# Patient Record
Sex: Male | Born: 1960 | Race: White | Marital: Single | Smoking: Former smoker
Health system: Southern US, Community
[De-identification: ages and names within clinical notes are randomized; demographics above are authoritative.]

## PROBLEM LIST (undated history)

## (undated) DIAGNOSIS — F419 Anxiety disorder, unspecified: Secondary | ICD-10-CM

## (undated) DIAGNOSIS — K219 Gastro-esophageal reflux disease without esophagitis: Secondary | ICD-10-CM

## (undated) HISTORY — PX: CHOLECYSTECTOMY: SHX55

## (undated) HISTORY — DX: Anxiety disorder, unspecified: F41.9

## (undated) HISTORY — DX: Gastro-esophageal reflux disease without esophagitis: K21.9

## (undated) HISTORY — PX: CYST EXCISION: SHX5701

## (undated) HISTORY — PX: DENTAL RESTORATION/EXTRACTION WITH X-RAY: SHX5796

---

## 2016-09-09 ENCOUNTER — Encounter: Payer: Self-pay | Admitting: Family Medicine

## 2016-09-26 ENCOUNTER — Encounter: Payer: Self-pay | Admitting: Gastroenterology

## 2016-11-14 ENCOUNTER — Ambulatory Visit (AMBULATORY_SURGERY_CENTER): Payer: Self-pay

## 2016-11-14 VITALS — Ht 69.0 in | Wt 193.0 lb

## 2016-11-14 DIAGNOSIS — Z1211 Encounter for screening for malignant neoplasm of colon: Secondary | ICD-10-CM

## 2016-11-14 MED ORDER — SUPREP BOWEL PREP KIT 17.5-3.13-1.6 GM/177ML PO SOLN: 1.0000 | Freq: Once | ORAL | 0 refills | Status: AC

## 2016-11-14 NOTE — Progress Notes (Signed)
Pt special needs and gets very anxious with medical procedures.  Family requests pre-med such as [facility] in admitting dept . Thanks Bev Drennen/PV

## 2016-11-14 NOTE — Progress Notes (Signed)
No allergies to eggs or soy No diet meds No home oxygen No past problems with anesthesia  Registered emmi 

## 2016-11-18 ENCOUNTER — Telehealth: Payer: Self-pay | Admitting: Gastroenterology

## 2016-11-18 NOTE — Telephone Encounter (Signed)
Pt is scheduled for direct colonoscopy

## 2016-11-18 NOTE — Telephone Encounter (Signed)
Pt is scheduled for direct screening colonoscopy 11/28/2016.  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  Pt has a cracked tooth and needs to have it removed under anesthesia next week.  His sister is calling to ask if it will be okay to have anesthesia for tooth extraction next week and then have colonoscopy with sedation several days later.  She is waiting to schedule tooth extraction depending on your recommendation.  Thanks, Juliann Pulse in [place].

## 2016-11-18 NOTE — Telephone Encounter (Signed)
Michael Richards,  This pt is cleared for anesthetic care at [facility].  Thanks,  [name] 

## 2016-11-21 NOTE — Telephone Encounter (Signed)
Mother advised.  OK to proceed. Michael Richards/PV

## 2016-11-21 NOTE — Telephone Encounter (Signed)
Thanks for letting me know, if okay with Jenny Reichmann it's okay with me. Thanks

## 2016-11-28 ENCOUNTER — Ambulatory Visit (AMBULATORY_SURGERY_CENTER): Payer: Medicare Other | Admitting: Gastroenterology

## 2016-11-28 ENCOUNTER — Encounter: Payer: Self-pay | Admitting: Gastroenterology

## 2016-11-28 VITALS — BP 124/81 | HR 78 | Temp 97.3°F | Resp 16 | Ht 69.0 in | Wt 193.0 lb

## 2016-11-28 DIAGNOSIS — D125 Benign neoplasm of sigmoid colon: Secondary | ICD-10-CM | POA: Diagnosis not present

## 2016-11-28 DIAGNOSIS — D122 Benign neoplasm of ascending colon: Secondary | ICD-10-CM | POA: Diagnosis not present

## 2016-11-28 DIAGNOSIS — D120 Benign neoplasm of cecum: Secondary | ICD-10-CM

## 2016-11-28 DIAGNOSIS — Z1211 Encounter for screening for malignant neoplasm of colon: Secondary | ICD-10-CM

## 2016-11-28 DIAGNOSIS — R195 Other fecal abnormalities: Secondary | ICD-10-CM | POA: Diagnosis not present

## 2016-11-28 MED ORDER — SODIUM CHLORIDE 0.9 % IV SOLN: 500.0000 mL | INTRAVENOUS | Status: AC
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
## 2016-11-28 NOTE — Progress Notes (Signed)
Pt's states no medical or surgical changes since previsit or office visit. Per sister.

## 2016-11-28 NOTE — Progress Notes (Signed)
Report given to PACU, vss   Late entry 0917

## 2016-11-29 ENCOUNTER — Telehealth: Payer: Self-pay

## 2016-11-29 DIAGNOSIS — D122 Benign neoplasm of ascending colon: Secondary | ICD-10-CM | POA: Diagnosis not present

## 2016-11-29 DIAGNOSIS — D120 Benign neoplasm of cecum: Secondary | ICD-10-CM | POA: Diagnosis not present

## 2016-11-29 NOTE — Addendum Note (Signed)
Addended by: Randall Hiss B on: 11/29/2016 07:02 AM   Modules accepted: Orders
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
## 2016-11-29 NOTE — Telephone Encounter (Signed)
  Follow up Call-  Call back number 11/28/2016  Post procedure Call Back phone  # [phone]   Permission to leave phone message Yes     Patient questions:  Do you have a fever, pain , or abdominal swelling? No. Pain Score  0 *  Have you tolerated food without any problems? Yes.    Have you been able to return to your normal activities? Yes.    Do you have any questions about your discharge instructions: Diet   No. Medications  No. Follow up visit  No.  Do you have questions or concerns about your Care? No.  Actions: * If pain score is 4 or above: No action needed, pain <4.

## 2016-12-05 ENCOUNTER — Encounter: Payer: Self-pay | Admitting: Gastroenterology

## 2018-01-25 ENCOUNTER — Emergency Department (HOSPITAL_COMMUNITY): Payer: Medicare Other

## 2018-01-25 ENCOUNTER — Other Ambulatory Visit: Payer: Self-pay

## 2018-01-25 ENCOUNTER — Inpatient Hospital Stay (HOSPITAL_COMMUNITY)
Admission: EM | Admit: 2018-01-25 | Discharge: 2018-01-28 | DRG: 641 | Disposition: A | Discharge status: Home / Self Care | Payer: Medicare Other | Attending: Internal Medicine | Admitting: Internal Medicine

## 2018-01-25 ENCOUNTER — Encounter (HOSPITAL_COMMUNITY): Payer: Self-pay

## 2018-01-25 DIAGNOSIS — K219 Gastro-esophageal reflux disease without esophagitis: Secondary | ICD-10-CM | POA: Diagnosis present

## 2018-01-25 DIAGNOSIS — R197 Diarrhea, unspecified: Secondary | ICD-10-CM | POA: Diagnosis not present

## 2018-01-25 DIAGNOSIS — E872 Acidosis: Secondary | ICD-10-CM | POA: Diagnosis present

## 2018-01-25 DIAGNOSIS — R569 Unspecified convulsions: Secondary | ICD-10-CM | POA: Diagnosis not present

## 2018-01-25 DIAGNOSIS — R0689 Other abnormalities of breathing: Secondary | ICD-10-CM | POA: Diagnosis present

## 2018-01-25 DIAGNOSIS — F419 Anxiety disorder, unspecified: Secondary | ICD-10-CM | POA: Diagnosis present

## 2018-01-25 DIAGNOSIS — R631 Polydipsia: Secondary | ICD-10-CM | POA: Diagnosis present

## 2018-01-25 DIAGNOSIS — R5381 Other malaise: Secondary | ICD-10-CM

## 2018-01-25 DIAGNOSIS — F429 Obsessive-compulsive disorder, unspecified: Secondary | ICD-10-CM | POA: Diagnosis present

## 2018-01-25 DIAGNOSIS — E871 Hypo-osmolality and hyponatremia: Secondary | ICD-10-CM | POA: Diagnosis present

## 2018-01-25 DIAGNOSIS — F909 Attention-deficit hyperactivity disorder, unspecified type: Secondary | ICD-10-CM | POA: Diagnosis present

## 2018-01-25 DIAGNOSIS — D649 Anemia, unspecified: Secondary | ICD-10-CM | POA: Diagnosis present

## 2018-01-25 DIAGNOSIS — Z87891 Personal history of nicotine dependence: Secondary | ICD-10-CM | POA: Diagnosis not present

## 2018-01-25 DIAGNOSIS — J9811 Atelectasis: Secondary | ICD-10-CM | POA: Diagnosis present

## 2018-01-25 DIAGNOSIS — E875 Hyperkalemia: Secondary | ICD-10-CM | POA: Diagnosis present

## 2018-01-25 DIAGNOSIS — F840 Autistic disorder: Secondary | ICD-10-CM | POA: Diagnosis present

## 2018-01-25 DIAGNOSIS — G9340 Encephalopathy, unspecified: Secondary | ICD-10-CM | POA: Diagnosis not present

## 2018-01-25 DIAGNOSIS — F79 Unspecified intellectual disabilities: Secondary | ICD-10-CM | POA: Diagnosis present

## 2018-01-25 DIAGNOSIS — R0902 Hypoxemia: Secondary | ICD-10-CM | POA: Diagnosis present

## 2018-01-25 DIAGNOSIS — Z978 Presence of other specified devices: Secondary | ICD-10-CM | POA: Insufficient documentation

## 2018-01-25 DIAGNOSIS — E876 Hypokalemia: Secondary | ICD-10-CM | POA: Diagnosis not present

## 2018-01-25 DIAGNOSIS — G4089 Other seizures: Secondary | ICD-10-CM | POA: Diagnosis present

## 2018-01-25 LAB — DIFFERENTIAL
Abs Immature Granulocytes: 0.1 10*3/uL (ref 0.0–0.1)
Basophils Absolute: 0 10*3/uL (ref 0.0–0.1)
Basophils Relative: 0 %
Eosinophils Absolute: 0.1 10*3/uL (ref 0.0–0.7)
Eosinophils Relative: 1 %
Immature Granulocytes: 1 %
LYMPHS ABS: 1.5 10*3/uL (ref 0.7–4.0)
LYMPHS PCT: 11 %
MONOS PCT: 7 %
Monocytes Absolute: 0.9 10*3/uL (ref 0.1–1.0)
NEUTROS ABS: 10.9 10*3/uL — AB (ref 1.7–7.7)
Neutrophils Relative %: 80 %

## 2018-01-25 LAB — I-STAT CHEM 8, ED
BUN: 10 mg/dL (ref 6–20)
BUN: 8 mg/dL (ref 6–20)
BUN: 8 mg/dL (ref 6–20)
CALCIUM ION: 0.94 mmol/L — AB (ref 1.15–1.40)
CALCIUM ION: 0.99 mmol/L — AB (ref 1.15–1.40)
CALCIUM ION: 0.87 mmol/L — AB (ref 1.15–1.40)
CHLORIDE: 75 mmol/L — AB (ref 98–111)
CHLORIDE: 81 mmol/L — AB (ref 98–111)
Chloride: 75 mmol/L — ABNORMAL LOW (ref 98–111)
Creatinine, Ser: 0.6 mg/dL — ABNORMAL LOW (ref 0.61–1.24)
Creatinine, Ser: 0.5 mg/dL — ABNORMAL LOW (ref 0.61–1.24)
Creatinine, Ser: 0.4 mg/dL — ABNORMAL LOW (ref 0.61–1.24)
Glucose, Bld: 91 mg/dL (ref 70–99)
Glucose, Bld: 118 mg/dL — ABNORMAL HIGH (ref 70–99)
Glucose, Bld: 105 mg/dL — ABNORMAL HIGH (ref 70–99)
HEMATOCRIT: 36 % — AB (ref 39.0–52.0)
HEMATOCRIT: 34 % — AB (ref 39.0–52.0)
HEMATOCRIT: 32 % — AB (ref 39.0–52.0)
Hemoglobin: 12.2 g/dL — ABNORMAL LOW (ref 13.0–17.0)
Hemoglobin: 11.6 g/dL — ABNORMAL LOW (ref 13.0–17.0)
Hemoglobin: 10.9 g/dL — ABNORMAL LOW (ref 13.0–17.0)
POTASSIUM: 5.4 mmol/L — AB (ref 3.5–5.1)
Potassium: 4.8 mmol/L (ref 3.5–5.1)
Potassium: 3.8 mmol/L (ref 3.5–5.1)
SODIUM: 105 mmol/L — AB (ref 135–145)
SODIUM: 106 mmol/L — AB (ref 135–145)
Sodium: 111 mmol/L — CL (ref 135–145)
TCO2: 21 mmol/L — AB (ref 22–32)
TCO2: 15 mmol/L — AB (ref 22–32)
TCO2: 26 mmol/L (ref 22–32)

## 2018-01-25 LAB — COMPREHENSIVE METABOLIC PANEL
ALT: 23 U/L (ref 0–44)
AST: 29 U/L (ref 15–41)
Albumin: 3.9 g/dL (ref 3.5–5.0)
Alkaline Phosphatase: 83 U/L (ref 38–126)
Anion gap: 16 — ABNORMAL HIGH (ref 5–15)
BUN: 9 mg/dL (ref 6–20)
CHLORIDE: 75 mmol/L — AB (ref 98–111)
CO2: 17 mmol/L — AB (ref 22–32)
CREATININE: 0.63 mg/dL (ref 0.61–1.24)
Calcium: 8.4 mg/dL — ABNORMAL LOW (ref 8.9–10.3)
GFR calc Af Amer: >60 mL/min (ref >60)
GFR calc non Af Amer: >60 mL/min (ref >60)
Glucose, Bld: 94 mg/dL (ref 70–99)
POTASSIUM: 4.4 mmol/L (ref 3.5–5.1)
SODIUM: 108 mmol/L — AB (ref 135–145)
Total Bilirubin: 1.7 mg/dL — ABNORMAL HIGH (ref 0.3–1.2)
Total Protein: 6 g/dL — ABNORMAL LOW (ref 6.5–8.1)

## 2018-01-25 LAB — I-STAT ARTERIAL BLOOD GAS, ED
ACID-BASE EXCESS: 3 mmol/L — AB (ref 0.0–2.0)
BICARBONATE: 26.7 mmol/L (ref 20.0–28.0)
O2 Saturation: 100 %
PCO2 ART: 35.9 mmHg (ref 32.0–48.0)
PH ART: 7.476 — AB (ref 7.350–7.450)
PO2 ART: 383 mmHg — AB (ref 83.0–108.0)
Patient temperature: 97.2
TCO2: 28 mmol/L (ref 22–32)

## 2018-01-25 LAB — I-STAT TROPONIN, ED: Troponin i, poc: 0.01 ng/mL (ref 0.00–0.08)

## 2018-01-25 LAB — CBC
HEMATOCRIT: 33.8 % — AB (ref 39.0–52.0)
Hemoglobin: 12.7 g/dL — ABNORMAL LOW (ref 13.0–17.0)
MCH: 31.6 pg (ref 26.0–34.0)
MCHC: 37.6 g/dL — ABNORMAL HIGH (ref 30.0–36.0)
MCV: 84.1 fL (ref 78.0–100.0)
Platelets: 239 10*3/uL (ref 150–400)
RBC: 4.02 MIL/uL — ABNORMAL LOW (ref 4.22–5.81)
RDW: 12.9 % (ref 11.5–15.5)
WBC: 13.5 10*3/uL — ABNORMAL HIGH (ref 4.0–10.5)

## 2018-01-25 LAB — PROTIME-INR
INR: 1.05
Prothrombin Time: 13.6 seconds (ref 11.4–15.2)

## 2018-01-25 LAB — OSMOLALITY: OSMOLALITY: 225 mosm/kg — AB (ref 275–295)

## 2018-01-25 LAB — SODIUM: SODIUM: 110 mmol/L — AB (ref 135–145)

## 2018-01-25 LAB — CBG MONITORING, ED: Glucose-Capillary: 115 mg/dL — ABNORMAL HIGH (ref 70–99)

## 2018-01-25 LAB — APTT: aPTT: 28 seconds (ref 24–36)

## 2018-01-25 LAB — OSMOLALITY, URINE: Osmolality, Ur: 91 mOsm/kg — ABNORMAL LOW (ref 300–900)

## 2018-01-25 LAB — SODIUM, URINE, RANDOM: Sodium, Ur: 19 mmol/L

## 2018-01-25 MED ORDER — PROPOFOL 1000 MG/100ML IV EMUL
INTRAVENOUS | Status: AC
  Filled 2018-01-25: qty 100

## 2018-01-25 MED ORDER — MIDAZOLAM HCL 2 MG/2ML IJ SOLN: 2.0000 mg | INTRAMUSCULAR | Status: DC | PRN

## 2018-01-25 MED ORDER — LORAZEPAM 2 MG/ML IJ SOLN
1.0000 mg | Freq: Once | INTRAMUSCULAR | Status: AC
  Administered 2018-01-25: 1 mg via INTRAVENOUS

## 2018-01-25 MED ORDER — FENTANYL CITRATE (PF) 100 MCG/2ML IJ SOLN
100.0000 ug | INTRAMUSCULAR | Status: DC | PRN
  Administered 2018-01-26: 100 ug via INTRAVENOUS
  Filled 2018-01-25 (×2): qty 2

## 2018-01-25 MED ORDER — LORAZEPAM 2 MG/ML IJ SOLN
INTRAMUSCULAR | Status: AC
  Filled 2018-01-25: qty 1

## 2018-01-25 MED ORDER — SODIUM CHLORIDE 3 % IV SOLN
INTRAVENOUS | Status: DC
  Filled 2018-01-25: qty 500

## 2018-01-25 MED ORDER — FAMOTIDINE IN NACL 20-0.9 MG/50ML-% IV SOLN
20.0000 mg | Freq: Two times a day (BID) | INTRAVENOUS | Status: DC
  Administered 2018-01-26 (×2): 20 mg via INTRAVENOUS
  Filled 2018-01-25 (×2): qty 50

## 2018-01-25 MED ORDER — PROPOFOL 1000 MG/100ML IV EMUL
0.0000 ug/kg/min | INTRAVENOUS | Status: DC
  Administered 2018-01-26: 40 ug/kg/min via INTRAVENOUS
  Administered 2018-01-26: 30 ug/kg/min via INTRAVENOUS
  Filled 2018-01-25 (×2): qty 100

## 2018-01-25 MED ORDER — SODIUM CHLORIDE 0.9 % IV SOLN
1.0000 g | Freq: Once | INTRAVENOUS | Status: AC
  Administered 2018-01-25: 1 g via INTRAVENOUS
  Filled 2018-01-25: qty 10

## 2018-01-25 MED ORDER — ETOMIDATE 2 MG/ML IV SOLN
INTRAVENOUS | Status: AC | PRN
  Administered 2018-01-25: 20 mg via INTRAVENOUS

## 2018-01-25 MED ORDER — SODIUM CHLORIDE 3 % IV SOLN
INTRAVENOUS | Status: DC
  Administered 2018-01-25: 50 mL/h via INTRAVENOUS

## 2018-01-25 MED ORDER — FENTANYL CITRATE (PF) 100 MCG/2ML IJ SOLN
100.0000 ug | INTRAMUSCULAR | Status: DC | PRN
  Administered 2018-01-25: 100 ug via INTRAVENOUS

## 2018-01-25 MED ORDER — SODIUM CHLORIDE 3 % IV SOLN: INTRAVENOUS | Status: DC

## 2018-01-25 MED ORDER — SODIUM CHLORIDE 3 % IV SOLN
INTRAVENOUS | Status: DC
  Administered 2018-01-25: 75 mL/h via INTRAVENOUS
  Filled 2018-01-25 (×3): qty 500

## 2018-01-25 MED ORDER — LEVETIRACETAM IN NACL 1500 MG/100ML IV SOLN
1500.0000 mg | INTRAVENOUS | Status: AC
  Administered 2018-01-25: 1500 mg via INTRAVENOUS
  Filled 2018-01-25: qty 100

## 2018-01-25 MED ORDER — SUCCINYLCHOLINE CHLORIDE 20 MG/ML IJ SOLN
INTRAMUSCULAR | Status: AC | PRN
  Administered 2018-01-25: 100 mg via INTRAVENOUS

## 2018-01-25 MED ORDER — HEPARIN SODIUM (PORCINE) 5000 UNIT/ML IJ SOLN
5000.0000 [IU] | Freq: Three times a day (TID) | INTRAMUSCULAR | Status: DC
  Administered 2018-01-26 – 2018-01-28 (×8): 5000 [IU] via SUBCUTANEOUS
  Filled 2018-01-25 (×7): qty 1

## 2018-01-25 NOTE — ED Triage Notes (Signed)
Pt arrived via [facility] called out as Code Stroke for AMS from home with LSN at 1500. Family reports new swollen face. Pt holding arms up in air upon arrival, with shaking movements when touched by any staff. Attempted to get pt in CT scanner pt started having a seizure, placed on 4L O2 and back on stretcher with support from staff.

## 2018-01-25 NOTE — Progress Notes (Signed)
PCCM Interval Progress Note  Discussed repeat Na via iSTAT with ED RN.  iSTAT resulted with Na of 110.  This was after roughly only 30 min of 3% at 2ml/hr, roughly 20 min at 44ml/hr, and roughly 10 - 15 min at 63ml/hr (after I asked for it to be increased during my eval based on calculations that a rate of 53ml/hr would be adequate for correction of 0.34mmol/L/hr).  I question accuracy of iSTAT; therefore, asked RN to please repeat.  If repeat is indeed 110 or higher, I've asked RN to lower 3% from 13ml/hr back to 26ml/hr.  If repeat is still low, then maintain at 67ml/hr for now and follow repeat labs in 2 hours.  Also discussed with pharmacy who will follow labs closely and call [facility] if rate of adjustment is too rapid and rate of 3% infusion needs to changed.   Michael Richards, [place] - C 01/25/2018, 9:53 PM

## 2018-01-25 NOTE — ED Notes (Signed)
Attempted report x1. 

## 2018-01-25 NOTE — ED Notes (Signed)
Hypertonic solution IV increased to 97ml/hr per verbal order R. Shearon Stalls, PA-C.

## 2018-01-25 NOTE — H&P (Signed)
NAME:  Michael Richards, MRN:  [id number], DOB:  07/11/1960, LOS: 0 ADMISSION DATE:  01/25/2018, CONSULTATION DATE:  01/25/18 REFERRING MD:  Gilford Raid CHIEF COMPLAINT:  AMS   Brief History   Michael Richards is a 57 y.o. 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  Required intubation for airway protection.  Significant Hospital Events   10/3 > admit.  Consults: date of consult/date signed off & final recs:  Neurology 10/3  Procedures (surgical and bedside):  ETT 10/3 >  Significant Diagnostic Tests:  CT head 10/3 > negative mod motion degraded exam.  Micro Data: None.  Antimicrobials:  None.   Subjective:  On vent, somewhat agitated.  Has been shaking feet since arrival in ED.  EEG negative for seizures.  Objective   Blood pressure (!) 232/102, pulse 73, temperature (!) 97.1 F (36.2 C), temperature source Rectal, resp. rate 20, height 5\' 10"  (1.778 m), weight 86.2 kg, SpO2 93 %.    Vent Mode: PRVC FiO2 (%):  [100 %] 100 % Set Rate:  [16 bmp] 16 bmp Vt Set:  [580 mL] 580 mL PEEP:  [5 cmH20] 5 cmH20   Intake/Output Summary (Last 24 hours) at 01/25/2018 2050 Last data filed at 01/25/2018 1916 Gross per 24 hour  Intake 84.85 ml  Output -  Net 84.85 ml   Filed Weights   01/25/18 1852  Weight: 86.2 kg    Examination: General: Young male, resting in bed, in NAD. Neuro: Sedated, somewhat agitated.  Bilateral feet shaking. HEENT: [place]/AT. Sclerae anicteric.  ETT in place. Cardiovascular: RRR, no M/R/G.  Lungs: Respirations even and unlabored.  CTA bilaterally, No W/R/R. Abdomen: BS x 4, soft, NT/ND.  Musculoskeletal: No gross deformities, no edema.  Skin: Intact, warm, no rashes.  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 Pt also on an SSRI (escitalopram) that per mother was just started 1.5 - 2 weeks  ago.  Given recent initiation of SSRI, timing of severe hyponatremia is likely acute to subacute; therefore, would not aggressively correct Na but rather aim for standard 0.53mmol/L/hr.  Per calculations, to achieve this rate of correction using 3% saline, a rate of 65 ml/hr would be adequate. - Continue 3% NS, place at rate of 70ml/hr for now. - Check Na q2hrs x 2 then q4hrs, goal is to have Na rise of 0.69mmol/L/hr.  Hypocalcemia. - 1g Ca gluconate.  Seizures - presumed due to above. - Correct Na as above. - Neurology following. - AED's per neuro - received 1500mg  loading dose Keppra but no further AED's planned for now.  Respiratory insufficiency - due to inability to protect the airway in the setting of seizures. - Full vent support. - Assess ABG. - Wean as able. - Daily SBT. - Bronchial hygiene. - Follow CXR.  Sedation needs due to mechanical ventilation. - Propofol gtt / fentanyl PRN / midazolam PRN. - RASS goal: 0 to -1. - Daily WUA.  Loose stools - ? Due to excessive water intake.  No recent abx use to suspect C.diff. - Will send GI panel for completeness.  Hx autism, mental disability, OCD, ADHD. - Hold preadmission cogentin, clonazepam, escitalopram, risperidone.   Disposition / Summary of Today's Plan 01/25/18   Admit to ICU. Continue 3% NS, ensure Na does not rise faster than 0.15mmol/L/hr.    Diet: NPO. Pain/Anxiety/Delirium protocol (if  indicated): in place. VAP protocol (if indicated): in place. DVT prophylaxis: SCD's / Heparin. GI prophylaxis: Famotidine. Hyperglycemia protocol: None. Mobility: Bedrest. Code Status: Full. Family Communication: Mother, Sister, Brother in law updated at bedside.  Labs   CBC: Recent Labs  Lab 01/25/18 1824 01/25/18 1828 01/25/18 1907  WBC 13.5*  --   --   NEUTROABS 10.9*  --   --   HGB 12.7* 12.2* 11.6*  HCT 33.8* 36.0* 34.0*  MCV 84.1  --   --   PLT 239  --   --    Basic Metabolic Panel: Recent Labs  Lab  01/25/18 1824 01/25/18 1828 01/25/18 1907  NA 108* 105* 106*  K 4.4 4.8 3.8  CL 75* 75* 75*  CO2 17*  --   --   GLUCOSE 94 91 118*  BUN 9 10 8   CREATININE 0.63 0.60* 0.50*  CALCIUM 8.4*  --   --    GFR: Estimated Creatinine Clearance: 105.2 mL/min (A) (by C-G formula based on SCr of 0.5 mg/dL (L)). Recent Labs  Lab 01/25/18 1824  WBC 13.5*   Liver Function Tests: Recent Labs  Lab 01/25/18 1824  AST 29  ALT 23  ALKPHOS 83  BILITOT 1.7*  PROT 6.0*  ALBUMIN 3.9   No results for input(s): LIPASE, AMYLASE in the last 168 hours. No results for input(s): AMMONIA in the last 168 hours. ABG    Component Value Date/Time   TCO2 15 (L) 01/25/2018 1907    Coagulation Profile: Recent Labs  Lab 01/25/18 1824  INR 1.05   Cardiac Enzymes: No results for input(s): CKTOTAL, CKMB, CKMBINDEX, TROPONINI in the last 168 hours. HbA1C: No results found for: HGBA1C CBG: Recent Labs  Lab 01/25/18 1907  GLUCAP 115*    Admitting History of Present Illness.   Pt is encephelopathic; therefore, this HPI is obtained from chart review. Michael Richards is a 57 y.o. male who has a PMH as outlined below including but not limited to autism, mental disability, OCD, ADHD, and psychogenic polydipsia.  He presented to [facility] ED evening of 10/3 with AMS.  Was last seen normal around 1500 that afternoon.  He was initially brought in as code stroke and while getting CT of head, he had a tonic clonic seizure.  Code stroke was cancelled and pt was taken back to ED where he was intubated for airway protection. Labs demonstrated severe hyponatremia with Na of 106 which was felt to be due to psychogenic polydipsia vs hypovolemia from gastroenteritis / loose stools.  Under "Care Everywhere", his last Na on 8/8 was 137 from a family medicine outpatient visit.  He was seen in consultation by neurology and was loaded with 1500mg  keppra.  He was also started on 3% NS.  Review of Systems:   Not able to obtain as  pt is encephalopathic.  Past medical history  He,  has a past medical history of Anxiety and GERD (gastroesophageal reflux disease).   Surgical History    Past Surgical History:  Procedure Laterality Date  . CHOLECYSTECTOMY    . CYST EXCISION     back neck  . DENTAL RESTORATION/EXTRACTION WITH X-RAY       Social History   Social History   Socioeconomic History  . Marital status: Single    Spouse name: Not on file  . Number of children: Not on file  . Years of education: Not on file  . Highest education level: Not on file  Occupational History  . Not  on file  Social Needs  . Financial resource strain: Not on file  . Food insecurity:    Worry: Not on file    Inability: Not on file  . Transportation needs:    Medical: Not on file    Non-medical: Not on file  Tobacco Use  . Smoking status: Former Smoker    Last attempt to quit: 11/15/1995    Years since quitting: 22.2  . Smokeless tobacco: Never Used  Substance and Sexual Activity  . Alcohol use: Yes    Comment: "sometimes beer and wine"  . Drug use: No  . Sexual activity: Not on file  Lifestyle  . Physical activity:    Days per week: Not on file    Minutes per session: Not on file  . Stress: Not on file  Relationships  . Social connections:    Talks on phone: Not on file    Gets together: Not on file    Attends religious service: Not on file    Active member of club or organization: Not on file    Attends meetings of clubs or organizations: Not on file    Relationship status: Not on file  . Intimate partner violence:    Fear of current or ex partner: Not on file    Emotionally abused: Not on file    Physically abused: Not on file    Forced sexual activity: Not on file  Other Topics Concern  . Not on file  Social History Narrative  . Not on file  ,  reports that he quit smoking about 22 years ago. He has never used smokeless tobacco. He reports that he drinks alcohol. He reports that he does not use  drugs.   Family history   His family history includes Colon cancer (age of onset: 57) in his paternal uncle.   Allergies No Known Allergies  Home meds  Prior to Admission medications   Medication Sig Start Date End Date Taking? Authorizing Provider  benztropine (COGENTIN) 2 MG tablet Take 2 mg by mouth 2 (two) times daily. 01/20/18  Yes [provider]  clonazePAM (KLONOPIN) 0.5 MG tablet Take 0.125-0.75 mg by mouth See admin instructions. 0.125mg  in the morning and afternoon, then 0.75mg  at bedtime.   Yes [provider]  escitalopram (LEXAPRO) 10 MG tablet Take 10 mg by mouth daily. 01/24/18  Yes [provider]  Multiple Vitamins-Minerals (CENTRUM SILVER PO) Take 1 tablet by mouth daily.    Yes [provider]  ranitidine (ZANTAC) 300 MG capsule Take 300 mg by mouth at bedtime.  11/04/17  Yes [provider]  risperiDONE (RISPERDAL) 1 MG tablet Take 1 mg by mouth daily.    Yes [provider]   CC time: 35 min.   Montey Hora, [facility] Pager: [phone]  or [phone] 01/25/2018, 8:50 PM

## 2018-01-25 NOTE — ED Notes (Signed)
Hypertonic solution IV decreased to 67ml/hr per verbal order Dr. Gilford Raid.

## 2018-01-25 NOTE — Code Documentation (Signed)
Successful intubation by Dr. Tobi Bastos

## 2018-01-25 NOTE — Consult Note (Signed)
Neurology Consultation Reason for Consult: Seizure Referring Physician: Aileen Pilot  CC: seizure  History is obtained from:Family  HPI: Michael Richards is a 57 y.o. male with a history of mental retardation and psychogenic polydipsia who was normal earlier today when someone dropped him off at home at 3 PM.  On his mother's arrival home a couple of hours later, she found him slumped over the counter and severely altered and diaphoretic.  She dialed 911 who activated a code stroke.  On arrival, he was thrashing, with increased tone, but did not clearly have any ongoing seizure activity.  After take him to the CT scanner, he did have clear clonic activity with flexion of his upper extremities that was clearly ictal.  This lasted for approximately 30 seconds followed by postictal breathing.  Do this, CT scan was aborted out of concern for his airway and he was taken back to the room where he has gradually shown some signs of improvement, but continues to alternately lift his legs and move his arms in an agitated fashion, but not rhythmic and not reasonably seizure activity.   Family does note that he has been complaining of some loose stools recently as well.  LKW: 3 PM tpa given?: no, not a stroke    ROS:  Unable to obtain due to altered mental status.   Past Medical History:  Diagnosis Date  . Anxiety   . GERD (gastroesophageal reflux disease)      Family History  Problem Relation Age of Onset  . Colon cancer Paternal Uncle 18     Social History:  reports that he quit smoking about 22 years ago. He has never used smokeless tobacco. He reports that he drinks alcohol. He reports that he does not use drugs.   Exam: Current vital signs: BP (!) 232/102   Pulse 73   Temp (!) 97.1 F (36.2 C) (Rectal)   Resp 20   Ht 5\' 10"  (1.778 m)   Wt 86.2 kg   SpO2 98%   BMI 27.26 kg/m  Vital signs in last 24 hours: Temp:  [97.1 F (36.2 C)] 97.1 F (36.2 C) (10/03 1927) Pulse Rate:   [73] 73 (10/03 1915) Resp:  [20] 20 (10/03 1915) BP: (232)/(102) 232/102 (10/03 1915) SpO2:  [98 %] 98 % (10/03 1915) Weight:  [86.2 kg] 86.2 kg (10/03 1852)   Physical Exam  Constitutional: Appears well-developed and well-nourished.  Psych: Does not respond Eyes: No scleral injection HENT: No OP obstrucion Head: Normocephalic.  Cardiovascular: Normal rate and regular rhythm.  Respiratory: Effort normal, non-labored breathing GI: Soft.  No distension. There is no tenderness.  Skin: WDI  Neuro: Mental Status: Patient is obtunded, though he does respond to noxious stimulation with localization initially Cranial Nerves: II: Does not blink to threat from either direction pupils are equal, round, and reactive to light.   III,IV, VI: Eyes are midline, he resists head turning, no eye deviation V: He does respond to noxious stimulation bilateral face VII: Facial movement is symmetric.  Motor: He has good strength in all 4 extremities, though he does not cooperate with formal strength testing  sensory: He responds to noxious stimulation in all 4 extremities  Deep Tendon Reflexes: 2+ and symmetric in the biceps and patellae.  No ankle clonus. Cerebellar: Does not perform  I have reviewed labs in epic and the results pertinent to this consultation are: CMP-sodium 108, CO2 17  CT head- pending   Impression: 57 year old male with new onset seizure  in the setting of severe hyponatremia.  I suspect that this represents psychogenic polydipsia or hyponatremia due to gastroenteritis with subsequent altered mental status and seizure.  It is not clear to me that the movements that he was doing on arrival or seizure activity, but he did have a clear seizure in the CT scanner.  Though he will not necessarily need long-term antiepileptic therapy, pending immediate evaluation I would favor giving him at least a single dose of Keppra.  I am not certain of the acuity of the hyponatremia, the most  recent sodium from 9/12 was 137.  Recommendations: 1) stat EEG 2) CT head 3) load with Keppra 1500 mg x 1 4) hyponatremia per ER/  Roland Rack, MD [facility] [phone]  If 7pm- 7am, please page neurology on call as listed in [place].

## 2018-01-25 NOTE — Procedures (Signed)
History: 57 year old male presented with hyponatremia with seizure and persistent altered mental status  Sedation: Propofol  Technique: This is a 21 channel routine scalp EEG performed at the bedside with bipolar and monopolar montages arranged in accordance to the international 10/20 system of electrode placement. One channel was dedicated to EKG recording.    Background: The background consists predominantly of low voltage generalized irregular delta activity.  There is significant elect to go artifact throughout the study, but during brief periods of attenuation, there is no underlying concerning activity.  There is a posterior dominant rhythm of 9 to 10 Hz which is poorly formed and poorly sustained.  Photic stimulation: Physiologic driving is not performed  EEG Abnormalities: 1) generalized irregular slow activity  Clinical Interpretation: This  EEG is consistent with a generalized nonspecific cerebral dysfunction (encephalopathy).  There was no seizure or seizure predisposition recorded on this study. Please note that a normal EEG does not preclude the possibility of epilepsy.   Roland Rack, MD [facility] [phone]  If 7pm- 7am, please page neurology on call as listed in [place].

## 2018-01-25 NOTE — ED Notes (Signed)
[facility] called to cancel code stroke per Dr Kirkpatrick 

## 2018-01-25 NOTE — ED Provider Notes (Signed)
Procedure Name: Intubation Date/Time: 01/25/2018 11:07 PM Performed by: Keenan Bachelor, MD Pre-anesthesia Checklist: Patient identified, Emergency Drugs available, Suction available, Patient being monitored and Timeout performed Oxygen Delivery Method: Ambu bag Preoxygenation: Pre-oxygenation with 100% oxygen Induction Type: Rapid sequence Ventilation: Mask ventilation without difficulty Laryngoscope Size: Mac and 3 Grade View: Grade I Tube size: 7.5 mm Number of attempts: 1 Placement Confirmation: ETT inserted through vocal cords under direct vision,  Positive ETCO2 and Breath sounds checked- equal and bilateral Secured at: 23 cm Tube secured with: ETT holder Difficulty Due To: Difficult Airway- due to large tongue and Difficulty was anticipated         Keenan Bachelor, MD 01/25/18 1027    Isla Pence, MD 01/25/18 2321

## 2018-01-25 NOTE — ED Notes (Signed)
Code stroke canceled  per Leonel Ramsay MD

## 2018-01-25 NOTE — Progress Notes (Signed)
Stat EEG completed, results pending.  Dr Leonel Ramsay reviewed.

## 2018-01-25 NOTE — ED Provider Notes (Signed)
[facility] EMERGENCY DEPARTMENT Provider Note   CSN: [id number] Arrival date & time: 01/25/18  6789     History   Chief Complaint Chief Complaint  Patient presents with  . Code Stroke    HPI Michael Richards is a 57 y.o. male.  Pt presents to the ED with altered level of consciousness.  EMS called a code stroke.  Per EMS, the pt was normal at 1500.  Family came home and found him very confused and not talking.  Pt is unable to give any hx.     Past Medical History:  Diagnosis Date  . Anxiety   . GERD (gastroesophageal reflux disease)     There are no active problems to display for this patient.   Past Surgical History:  Procedure Laterality Date  . CHOLECYSTECTOMY    . CYST EXCISION     back neck  . DENTAL RESTORATION/EXTRACTION WITH X-RAY          Home Medications    Prior to Admission medications   Medication Sig Start Date End Date Taking? Authorizing Provider  benztropine (COGENTIN) 2 MG tablet Take 2 mg by mouth 2 (two) times daily. 01/20/18  Yes [provider]  clonazePAM (KLONOPIN) 0.5 MG tablet Take 0.125-0.75 mg by mouth See admin instructions. 0.125mg  in the morning and afternoon, then 0.75mg  at bedtime.   Yes [provider]  escitalopram (LEXAPRO) 10 MG tablet Take 10 mg by mouth daily. 01/24/18  Yes [provider]  Multiple Vitamins-Minerals (CENTRUM SILVER PO) Take 1 tablet by mouth daily.    Yes [provider]  ranitidine (ZANTAC) 300 MG capsule Take 300 mg by mouth at bedtime.  11/04/17  Yes [provider]  risperiDONE (RISPERDAL) 1 MG tablet Take 1 mg by mouth daily.    Yes [provider]    Family History Family History  Problem Relation Age of Onset  . Colon cancer Paternal Uncle 67    Social History Social History   Tobacco Use  . Smoking status: Former Smoker    Last attempt to quit: 11/15/1995    Years since quitting: 22.2  . Smokeless tobacco: Never Used    Substance Use Topics  . Alcohol use: Yes    Comment: "sometimes beer and wine"  . Drug use: No     Allergies   Patient has no known allergies.   Review of Systems Review of Systems  Unable to perform ROS: Mental status change  All other systems reviewed and are negative.    Physical Exam Updated Vital Signs BP (!) 232/102   Pulse 73   Temp (!) 97.1 F (36.2 C) (Rectal)   Resp 20   Ht 5\' 10"  (1.778 m)   Wt 86.2 kg   SpO2 93%   BMI 27.26 kg/m   Physical Exam  Constitutional: He appears well-developed and well-nourished.  HENT:  Head: Normocephalic and atraumatic.  Right Ear: External ear normal.  Left Ear: External ear normal.  Nose: Nose normal.  Mouth/Throat: Oropharynx is clear and moist.  Eyes: Pupils are equal, round, and reactive to light. Conjunctivae and EOM are normal.  Neck: Normal range of motion. Neck supple.  Cardiovascular: Normal rate, regular rhythm, normal heart sounds and intact distal pulses.  Pulmonary/Chest: Effort normal and breath sounds normal. Tachypnea noted.  Abdominal: Soft. Bowel sounds are normal.  Musculoskeletal: Normal range of motion.  Neurological:  When pt first arrived, he was shaking all 4 extremities, and very agitated.  He  would stop and seem to respond.    Skin: Skin is warm. Capillary refill takes less than 2 seconds.  Nursing note and vitals reviewed.    ED Treatments / Results  Labs (all labs ordered are listed, but only abnormal results are displayed) Labs Reviewed  CBC - Abnormal; Notable for the following components:      Result Value   WBC 13.5 (*)    RBC 4.02 (*)    Hemoglobin 12.7 (*)    HCT 33.8 (*)    MCHC 37.6 (*)    All other components within normal limits  DIFFERENTIAL - Abnormal; Notable for the following components:   Neutro Abs 10.9 (*)    All other components within normal limits  COMPREHENSIVE METABOLIC PANEL - Abnormal; Notable for the following components:   Sodium 108 (*)    Chloride  75 (*)    CO2 17 (*)    Calcium 8.4 (*)    Total Protein 6.0 (*)    Total Bilirubin 1.7 (*)    Anion gap 16 (*)    All other components within normal limits  CBG MONITORING, ED - Abnormal; Notable for the following components:   Glucose-Capillary 115 (*)    All other components within normal limits  I-STAT CHEM 8, ED - Abnormal; Notable for the following components:   Sodium 105 (*)    Chloride 75 (*)    Creatinine, Ser 0.60 (*)    Calcium, Ion 0.94 (*)    TCO2 21 (*)    Hemoglobin 12.2 (*)    HCT 36.0 (*)    All other components within normal limits  I-STAT CHEM 8, ED - Abnormal; Notable for the following components:   Sodium 106 (*)    Chloride 75 (*)    Creatinine, Ser 0.50 (*)    Glucose, Bld 118 (*)    Calcium, Ion 0.99 (*)    TCO2 15 (*)    Hemoglobin 11.6 (*)    HCT 34.0 (*)    All other components within normal limits  PROTIME-INR  APTT  SODIUM  SODIUM  SODIUM  OSMOLALITY  OSMOLALITY, URINE  SODIUM, URINE, RANDOM  I-STAT TROPONIN, ED    EKG EKG Interpretation  Date/Time:  Thursday January 25 2018 18:37:58 EDT Ventricular Rate:  109 PR Interval:    QRS Duration: 94 QT Interval:  313 QTC Calculation: 422 R Axis:   54 Text Interpretation:  Sinus tachycardia Confirmed by Isla Pence [phone]) on 01/25/2018 7:19:10 PM   Radiology Ct Head Wo Contrast  Result Date: 01/25/2018 CLINICAL DATA:  Face swelling, shaking. Last seen normal at 1500 hours. EXAM: CT HEAD WITHOUT CONTRAST TECHNIQUE: Contiguous axial images were obtained from the base of the skull through the vertex without intravenous contrast. COMPARISON:  None. FINDINGS: Moderate motion degraded examination. BRAIN: No intraparenchymal hemorrhage, mass effect nor midline shift. The ventricles and sulci are normal. No acute large vascular territory infarcts. No abnormal extra-axial fluid collections. Basal cisterns are patent. VASCULAR: Unremarkable. SKULL/SOFT TISSUES: No skull fracture. No significant  soft tissue swelling. ORBITS/SINUSES: The included ocular globes and orbital contents are normal.Trace paranasal sinus mucosal thickening. Mastoid air cells are well aerated. OTHER: Life support lines in place. IMPRESSION: Negative moderately motion degraded CT HEAD without contrast. Acute findings discussed with and reconfirmed by Dr.Larina Lieurance on 01/25/2018 at 8:34 pm. Electronically Signed   By: Elon Alas M.D.   On: 01/25/2018 20:36   Dg Chest Portable 1 View  Result Date: 01/25/2018 CLINICAL DATA:  Intubated. EXAM: PORTABLE CHEST 1 VIEW COMPARISON:  Earlier today. FINDINGS: Endotracheal tube tip just above the level of the clavicles. Nasogastric tube extending into the stomach. Mildly enlarged cardiac silhouette with improvement. Clear lungs. Decrease in prominence of the pulmonary vasculature and interstitial markings. Thoracic spine degenerative changes. IMPRESSION: 1. Endotracheal tube slightly high in position. It is recommended that this be advanced 1 cm. 2. Improved changes of congestive heart failure. Electronically Signed   By: Claudie Revering M.D.   On: 01/25/2018 20:19   Dg Chest Portable 1 View  Result Date: 01/25/2018 CLINICAL DATA:  Acute shortness of breath. EXAM: PORTABLE CHEST 1 VIEW COMPARISON:  None FINDINGS: The cardiomediastinal silhouette is unremarkable. Mild peribronchial thickening noted. There is no evidence of focal airspace disease, pulmonary edema, suspicious pulmonary nodule/mass, pleural effusion, or pneumothorax. No acute bony abnormalities are identified. IMPRESSION: Mild peribronchial thickening without focal pneumonia. This is of uncertain chronicity. Electronically Signed   By: Margarette Canada M.D.   On: 01/25/2018 19:45    Procedures Procedure Name: Intubation Date/Time: 01/25/2018 8:45 PM Performed by: Isla Pence, MD Pre-anesthesia Checklist: Patient identified, Patient being monitored, Emergency Drugs available, Timeout performed and Suction  available Oxygen Delivery Method: Ambu bag Preoxygenation: Pre-oxygenation with 100% oxygen Induction Type: Rapid sequence Ventilation: Mask ventilation without difficulty Laryngoscope Size: Glidescope and 3 Tube size: 7.5 mm Number of attempts: 1 Placement Confirmation: ETT inserted through vocal cords under direct vision,  CO2 detector and Breath sounds checked- equal and bilateral Secured at: 23 cm Tube secured with: ETT holder Dental Injury: Teeth and Oropharynx as per pre-operative assessment       (including critical care time)  Medications Ordered in ED Medications  LORazepam (ATIVAN) 2 MG/ML injection (has no administration in time range)  sodium chloride (hypertonic) 3 % solution (75 mL/hr Intravenous New Bag/Given 01/25/18 1951)  propofol (DIPRIVAN) 1000 MG/100ML infusion (has no administration in time range)  levETIRAcetam (KEPPRA) IVPB 1500 mg/ 100 mL premix (0 mg Intravenous Stopped 01/25/18 1916)  LORazepam (ATIVAN) injection 1 mg (1 mg Intravenous Given 01/25/18 1932)  etomidate (AMIDATE) injection (20 mg Intravenous Given 01/25/18 1957)  succinylcholine (ANECTINE) injection (100 mg Intravenous Given 01/25/18 1958)     Initial Impression / Assessment and Plan / ED Course  I have reviewed the triage vital signs and the nursing notes.  Pertinent labs & imaging results that were available during my care of the patient were reviewed by me and considered in my medical decision making (see chart for details).  As code stroke was called, pt went to the CT scanner and had a seizure.  Dr. Leonel Ramsay (neuro) did respond to code stroke and saw this seizure.  His CT scan was aborted and he went back to his room.  He continues to make rhythmic movements with his arms and legs.  He is not responding to commands.  He was initially given keppra and ativan for the seizure  Pt acutely hyponatremic with severe mental status changes.  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  Sodium will be  corrected with 3% sodium per protocol.  Pt's GCS is worsening, so he was intubated.  Pt also has a bedside EEG that is just getting started.  Pt d/w CCM who will admit.  They asked we decrease rate of sodium down to 20 cc/hr.  This was done.  CRITICAL CARE Performed by: Isla Pence   Total critical care time: 45 minutes  Critical care time was exclusive of separately billable procedures and treating other patients.  Critical care was necessary to treat or prevent imminent or life-threatening deterioration.  Critical care was time spent personally by me on the following activities: development of treatment plan with patient and/or surrogate as well as nursing, discussions with consultants, evaluation of patient's response to treatment, examination of patient, obtaining history from patient or surrogate, ordering and performing treatments and interventions, ordering and review of laboratory studies, ordering and review of radiographic studies, pulse oximetry and re-evaluation of patient's condition.  Final Clinical Impressions(s) / ED Diagnoses   Final diagnoses:  Hyponatremia  Seizure [facility])    ED Discharge Orders    None       Isla Pence, MD 01/25/18 2048

## 2018-01-25 NOTE — ED Notes (Signed)
Given 2mg  ativan IV per Dr. Leonel Ramsay.

## 2018-01-25 NOTE — ED Notes (Signed)
1 mg Ativan given, pulled up by pharmacy, verbal from [facility] MD

## 2018-01-26 ENCOUNTER — Inpatient Hospital Stay (HOSPITAL_COMMUNITY): Payer: Medicare Other

## 2018-01-26 LAB — COMPREHENSIVE METABOLIC PANEL
ALT: 22 U/L (ref 0–44)
AST: 36 U/L (ref 15–41)
Albumin: 3.3 g/dL — ABNORMAL LOW (ref 3.5–5.0)
Alkaline Phosphatase: 75 U/L (ref 38–126)
Anion gap: 11 (ref 5–15)
BUN: 5 mg/dL — ABNORMAL LOW (ref 6–20)
CHLORIDE: 91 mmol/L — AB (ref 98–111)
CO2: 21 mmol/L — ABNORMAL LOW (ref 22–32)
CREATININE: 0.6 mg/dL — AB (ref 0.61–1.24)
Calcium: 8.4 mg/dL — ABNORMAL LOW (ref 8.9–10.3)
GFR calc Af Amer: >60 mL/min (ref >60)
GFR calc non Af Amer: >60 mL/min (ref >60)
GLUCOSE: 93 mg/dL (ref 70–99)
Potassium: 3.2 mmol/L — ABNORMAL LOW (ref 3.5–5.1)
Sodium: 123 mmol/L — ABNORMAL LOW (ref 135–145)
Total Bilirubin: 1.2 mg/dL (ref 0.3–1.2)
Total Protein: 5.1 g/dL — ABNORMAL LOW (ref 6.5–8.1)

## 2018-01-26 LAB — BASIC METABOLIC PANEL
Anion gap: 9 (ref 5–15)
Anion gap: 7 (ref 5–15)
BUN: 6 mg/dL (ref 6–20)
BUN: <5 mg/dL — ABNORMAL LOW (ref 6–20)
CALCIUM: 6.9 mg/dL — AB (ref 8.9–10.3)
CALCIUM: 8.2 mg/dL — AB (ref 8.9–10.3)
CO2: 21 mmol/L — ABNORMAL LOW (ref 22–32)
CO2: 24 mmol/L (ref 22–32)
CREATININE: 0.68 mg/dL (ref 0.61–1.24)
CREATININE: 0.75 mg/dL (ref 0.61–1.24)
Chloride: 77 mmol/L — ABNORMAL LOW (ref 98–111)
Chloride: 105 mmol/L (ref 98–111)
GFR calc Af Amer: >60 mL/min (ref >60)
GFR calc non Af Amer: >60 mL/min (ref >60)
GFR calc non Af Amer: >60 mL/min (ref >60)
Glucose, Bld: 619 mg/dL (ref 70–99)
Glucose, Bld: 84 mg/dL (ref 70–99)
Potassium: 2.5 mmol/L — CL (ref 3.5–5.1)
Potassium: 3.7 mmol/L (ref 3.5–5.1)
SODIUM: 107 mmol/L — AB (ref 135–145)
Sodium: 136 mmol/L (ref 135–145)

## 2018-01-26 LAB — POCT I-STAT 3, ART BLOOD GAS (G3+)
ACID-BASE DEFICIT: 1 mmol/L (ref 0.0–2.0)
BICARBONATE: 22.3 mmol/L (ref 20.0–28.0)
O2 SAT: 100 %
TCO2: 23 mmol/L (ref 22–32)
pCO2 arterial: 33.6 mmHg (ref 32.0–48.0)
pH, Arterial: 7.435 (ref 7.350–7.450)
pO2, Arterial: 172 mmHg — ABNORMAL HIGH (ref 83.0–108.0)

## 2018-01-26 LAB — URINALYSIS, ROUTINE W REFLEX MICROSCOPIC
Bilirubin Urine: NEGATIVE
Glucose, UA: NEGATIVE mg/dL
HGB URINE DIPSTICK: NEGATIVE
Ketones, ur: 5 mg/dL — AB
Leukocytes, UA: NEGATIVE
Nitrite: NEGATIVE
Protein, ur: NEGATIVE mg/dL
SPECIFIC GRAVITY, URINE: 1.001 — AB (ref 1.005–1.030)
pH: 6 (ref 5.0–8.0)

## 2018-01-26 LAB — CBC
HCT: 28 % — ABNORMAL LOW (ref 39.0–52.0)
Hemoglobin: 10.3 g/dL — ABNORMAL LOW (ref 13.0–17.0)
MCH: 31.3 pg (ref 26.0–34.0)
MCHC: 36.8 g/dL — AB (ref 30.0–36.0)
MCV: 85.1 fL (ref 78.0–100.0)
Platelets: 139 10*3/uL — ABNORMAL LOW (ref 150–400)
RBC: 3.29 MIL/uL — ABNORMAL LOW (ref 4.22–5.81)
RDW: 12.6 % (ref 11.5–15.5)
WBC: 5.9 10*3/uL (ref 4.0–10.5)

## 2018-01-26 LAB — HIV ANTIBODY (ROUTINE TESTING W REFLEX): HIV SCREEN 4TH GENERATION: NONREACTIVE

## 2018-01-26 LAB — GLUCOSE, CAPILLARY: GLUCOSE-CAPILLARY: 103 mg/dL — AB (ref 70–99)

## 2018-01-26 LAB — SODIUM: Sodium: 120 mmol/L — ABNORMAL LOW (ref 135–145)

## 2018-01-26 LAB — MAGNESIUM: MAGNESIUM: 1.6 mg/dL — AB (ref 1.7–2.4)

## 2018-01-26 LAB — TRIGLYCERIDES: Triglycerides: 62 mg/dL (ref <150)

## 2018-01-26 LAB — PHOSPHORUS: PHOSPHORUS: 2.1 mg/dL — AB (ref 2.5–4.6)

## 2018-01-26 LAB — MRSA PCR SCREENING: MRSA by PCR: NEGATIVE

## 2018-01-26 LAB — OSMOLALITY: OSMOLALITY: 242 mosm/kg — AB (ref 275–295)

## 2018-01-26 MED ORDER — DEXTROSE 5 % IV BOLUS
500.0000 mL | Freq: Once | INTRAVENOUS | Status: AC
  Administered 2018-01-26: 500 mL via INTRAVENOUS

## 2018-01-26 MED ORDER — CHLORHEXIDINE GLUCONATE 0.12% ORAL RINSE (MEDLINE KIT)
15.0000 mL | Freq: Two times a day (BID) | OROMUCOSAL | Status: DC
  Administered 2018-01-26 (×2): 15 mL via OROMUCOSAL

## 2018-01-26 MED ORDER — SODIUM CHLORIDE 0.9 % IV SOLN
INTRAVENOUS | Status: DC | PRN
  Administered 2018-01-26: 500 mL via INTRAVENOUS

## 2018-01-26 MED ORDER — PIPERACILLIN-TAZOBACTAM 3.375 G IVPB
3.3750 g | Freq: Three times a day (TID) | INTRAVENOUS | Status: DC
  Administered 2018-01-26: 3.375 g via INTRAVENOUS
  Filled 2018-01-26 (×2): qty 50

## 2018-01-26 MED ORDER — SODIUM CHLORIDE 0.9 % IV SOLN
INTRAVENOUS | Status: DC
  Administered 2018-01-26 – 2018-01-27 (×2): via INTRAVENOUS

## 2018-01-26 MED ORDER — LORAZEPAM 2 MG/ML IJ SOLN
INTRAMUSCULAR | Status: AC
  Administered 2018-01-26: 2 mg via INTRAVENOUS
  Filled 2018-01-26: qty 1

## 2018-01-26 MED ORDER — ORAL CARE MOUTH RINSE
15.0000 mL | OROMUCOSAL | Status: DC
  Administered 2018-01-26 (×5): 15 mL via OROMUCOSAL

## 2018-01-26 MED ORDER — LORAZEPAM 2 MG/ML IJ SOLN
2.0000 mg | INTRAMUSCULAR | Status: DC | PRN
  Administered 2018-01-26 (×3): 2 mg via INTRAVENOUS
  Filled 2018-01-26 (×2): qty 1

## 2018-01-26 MED ORDER — POTASSIUM CHLORIDE 10 MEQ/100ML IV SOLN
10.0000 meq | INTRAVENOUS | Status: AC
  Administered 2018-01-26 (×4): 10 meq via INTRAVENOUS
  Filled 2018-01-26 (×4): qty 100

## 2018-01-26 MED ORDER — HALOPERIDOL LACTATE 5 MG/ML IJ SOLN
5.0000 mg | Freq: Once | INTRAMUSCULAR | Status: AC
  Administered 2018-01-26: 5 mg via INTRAVENOUS
  Filled 2018-01-26: qty 1

## 2018-01-26 NOTE — Procedures (Signed)
Extubation Procedure Note  Patient Details:   Name: Michael Richards DOB: Sep 12, 1960 MRN: [id number]   Airway Documentation:    Vent end date: 01/26/18 Vent end time: 1342   Evaluation  O2 sats: stable throughout Complications: No apparent complications Patient did tolerate procedure well. Bilateral Breath Sounds: Clear, Diminished   Yes   Patient placed on  4 L with humidity, no stridor noted.  Pt not following commands and is trying to get out of the bed. Incentive spirometer not done at this time.  Mingo Amber Slyvester Latona 01/26/2018, 1:58 PM

## 2018-01-26 NOTE — Progress Notes (Signed)
[facility] Progress Note Patient Name: Michael Richards DOB: October 26, 1960 MRN: [id number]   Date of Service  01/26/2018  HPI/Events of Note  Multiple issues: Na+ = 107 and 2. Blood glucose = 619.  eICU Interventions  Will order: 1. 0.9 NaCl to run IV at 75 mL/hour.  2. Recheck blood glucose by POC.      Intervention Category Major Interventions: Electrolyte abnormality - evaluation and management  Jeffrie Stander Eugene 01/26/2018, 5:44 AM

## 2018-01-26 NOTE — Progress Notes (Signed)
RT assisted RN with transport from ED to [place] without any complications.

## 2018-01-26 NOTE — Progress Notes (Signed)
[place] Progress Note Patient Name: Michael Richards DOB: 10-21-1960 MRN: [id number]   Date of Service  01/26/2018  HPI/Events of Note  Na+ = 106 --> 111 (3% infusion rate decreased to 25 mL/hour) --> 120.   eICU Interventions  Will order: 1. D/C 3% NaCl  2. D5W 500 mL IV over 1 hour now.  3. Continue to trend Na+.     Intervention Category Major Interventions: Electrolyte abnormality - evaluation and management  Sommer,Steven Eugene 01/26/2018, 2:07 AM

## 2018-01-26 NOTE — Progress Notes (Signed)
Initial Nutrition Assessment  DOCUMENTATION CODES:   Not applicable  INTERVENTION:  Will follow this patient regarding swallowing ability and diet progression to determine nutrition intervention (newly extubated 10/4)  NUTRITION DIAGNOSIS:   Inadequate oral intake related to inability to eat as evidenced by NPO status.  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 Intubated 10/3. Newly extubated 10/4.  Visited Mr. Hoefling at bedside with his mother present. He is calm, asleep during most of assessment but wakes occasionally. Hx is provided by his mother, who lives with him. She says at home he enjoys routine. She either prepares or helps him to prepare all of his meals. Breakfast is bowl of cereal and smoothie with Muscle Milk and fruit, lunch he enjoys grilled cheese sandwiches, and dinner is casserole. Per mother, he drinks a lot of water, chugging bottles at a time; this habit has been more severe lately. She believes this is related to his OCD. In 11/2017 he was seen by his PCP, whom he told that he was doing this because he is "really thirsty". PCP noted his increased risk of developing diabetes due to taking antipsychotic med. No current A1C on file.    His mother noted he was having diarrhea recently; most likely due to excess water intake and electrolyte imbalance.   His mother says he lost weight in the past when he stopped drinking cola but weight has been stable recently. No depletions noted on physical exam. Patient does not meet criteria for malnutrition at this time.   Extubation performed today 10/4. Will follow this patient with regard to swallowing ability and diet progression to  determine nutrition intervention.   Labs reviewed: CBG 103-115, Sodium 123, Potassium 3.2, BUN 5, Creatinine 0.6 Medications reviewed.  NUTRITION - FOCUSED PHYSICAL EXAM:    Most Recent Value  Orbital Region  No depletion  Upper Arm Region  No depletion  Thoracic and Lumbar Region  No depletion  Buccal Region  No depletion  Temple Region  No depletion  Clavicle Bone Region  No depletion  Clavicle and Acromion Bone Region  No depletion  Scapular Bone Region  No depletion  Dorsal Hand  Unable to assess  Patellar Region  No depletion  Anterior Thigh Region  No depletion  Posterior Calf Region  No depletion  Edema (RD Assessment)  None  Hair  Reviewed  Eyes  Reviewed  Mouth  Reviewed  Skin  Reviewed  Nails  Unable to assess      Diet Order:   Diet Order            Diet NPO time specified  Diet effective now              EDUCATION NEEDS:   Not appropriate for education at this time  Skin:  Skin Assessment: Reviewed RN Assessment  Last BM:  PTA per chart  Height:   Ht Readings from Last 1 Encounters:  01/26/18 5\' 10"  (1.778 m)    Weight:   Wt Readings from Last 1 Encounters:  01/26/18 78.5 kg    Ideal Body Weight:  75.5 kg  BMI:  Body mass index is 24.83 kg/m.  Estimated Nutritional Needs:  Kcal:  1800-2000  Protein:  90-100  Fluid:  1.8-2.0 L or per MD    Loree Fee Farhad Burleson, Dietetic Intern [phone]

## 2018-01-26 NOTE — Progress Notes (Addendum)
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 PCCM asked to admit  Consults: date of consult/date signed off & final recs:  Neurology 01/25/18.  Procedures (surgical and bedside):  Intubation 01/25/18.  Subjective:  Patient's mother confirms history of excessive water intake. Patient is developmentally delayed and has been having increased obsessive behaviors over the past 6 months.  Objective   Blood pressure 109/82, pulse 69, temperature 99.3 F (37.4 C), resp. rate 17, height 5\' 10"  (1.778 m), weight 78.5 kg, SpO2 100 %.    Vent Mode: PRVC FiO2 (%):  [30 %-100 %] 30 % Set Rate:  [16 bmp] 16 bmp Vt Set:  [580 mL] 580 mL PEEP:  [5 cmH20] 5 cmH20 Plateau Pressure:  [13 cmH20-16 cmH20] 13 cmH20   Intake/Output Summary (Last 24 hours) at 01/26/2018 1123 Last data filed at 01/26/2018 0800 Gross per 24 hour  Intake 1100.52 ml  Output 7850 ml  Net -6749.48 ml   Filed Weights   01/25/18 1852 01/26/18 0500  Weight: 86.2 kg 78.5 kg    Examination: General: appears older than stated age. Thin, appears well nourished.  HENT: ETT and OGT place with no pressure ulceration. Lungs: Tolerating PRVC ventilation with clear lungs. Adequate tidal volumes on PSV. Cardiovascular: Extremities warm. HS distant. Abdomen: Abdomen is soft and non-tender. Rare bowel sounds. Extremities: No edema, no signs of injury. Neuro: Awake and agitated off sedation. Moves all limbs. GU: Foley catheter in place.  Resolved Hospital Problem list    Assessment & Plan:   Symptomatic Hyponatremia: Psychogenic polydipsia coupled with Lexapro and GI losses. Adequate acute correction at present.  Fluid restriction. Avoid hypotonic fluids and continue  BMET every 6h for now.  Seizures - most likely secondary to hyponatremia. EEG showed a generalized irregular slow activity with no underlying concerning activity. These findings point to encephalopathy with no true epileptic focus. Correction of hyponatremia should be sufficient.  Endotracheally Intubated on Mechanical ventilation for airway protection following post ictal state  Lung protective ventilation titrated to FiO2 to keep O2 sats >92%  I have stopped sedation and performed a 30 min SBT. RSBI was 31. Ready for extubation.  Hypokalemia- replete as necessary.   Non Gap Metabolic Acidosis- noted to have loose stools could be secondary to diarrhea.   Normocytic Anemia- check iron studies if Hgb <7 transfuse PRBCs and look for signs of acute blood loss. Currently stable.  Disposition / Summary of Today's Plan 01/26/18   Awake and extubate.    Nutritional status and diet: free water restriction. Pain/Anxiety/Delirium reduction: off sedation. VAP protocol (if indicated) in place. DVT prophylaxis: UFH GI prophylaxis: famotidine Hyperglycemia protocol: No coverage required.* Mobility:Progressive ambulation once extubated Antibiotic de-escalation: Will stop antibiotics and treat as sterile aspiration. Code Status: Full Family Communication:  Updated family at the bedside.  Labs and Ancillary Testing (personally reviewed)  CBC: mild anemia of acute illness at 10.3 , mild thrombocytopenia 139.  Chemistry:  Sodium has improved to 123. Hypokalemia.    ABG normal pH with adequate oxygenation.   Coagulation Profile: INR 1.05  Cardiac Enzymes: n/a  CBG: adequate control.  Microbiology:  polymicrobial Gram stain.  MRSA pcr negative.  CXR 10/4: mild bibasilar atelectasis.   CRITICAL CARE Performed by: Einar Grad  Noah Lembke   Total critical care time: 50 minutes  Critical care time was exclusive of separately billable procedures and treating other patients.  Critical care was  necessary to treat or prevent imminent or life-threatening deterioration.  Critical care was time spent personally by me on the following activities: development of treatment plan with patient and/or surrogate as well as nursing, discussions with consultants, evaluation of patient's response to treatment, examination of patient, obtaining history from patient or surrogate, ordering and performing treatments and interventions, ordering and review of laboratory studies, ordering and review of radiographic studies, pulse oximetry and re-evaluation of patient's condition.   Kipp Brood, MD [facility] ICU Physician [place]  Pager: [phone] Mobile: [phone] After hours: [phone].

## 2018-01-26 NOTE — Progress Notes (Signed)
Subjective: No further seizure  Exam: Vitals:   01/26/18 1800 01/26/18 1900  BP: 116/66 108/79  Pulse: 96 99  Resp: 19 (!) 25  Temp:    SpO2: 99% 100%   Gen: In bed, intubated Resp:  Abd: soft, nt  Neuro: sedated MS: does not open eyes or follow commands VH:SJWTG, fixates and tracks Motor: moves all extremities spontaneously and to stimuli.   Pertinent Labs: Na 123  Impression: 57 year old male with new onset seizure in the setting of MR and severe hyponatremia.  I suspect hyponatremia in the setting of psychogenic polydipsia and SSRI use are the culprits.  Recommendations: 1) no AEDs at this time 2) will follow  Roland Rack, MD [facility] [phone]  If 7pm- 7am, please page neurology on call as listed in [place].

## 2018-01-26 NOTE — Progress Notes (Signed)
[place] Progress Note Patient Name: Elbert Spickler DOB: 04-15-1961 MRN: [id number]   Date of Service  01/26/2018  HPI/Events of Note  Fever to 101.5 F - Currently not on Abx. Patient with hx of seizure. Suspect that he may have aspirated.   eICU Interventions  Will order: 1. Blood cultures X 2.  2. Tracheal Aspirate Culture.  3. UA w/reflex microscopic. 4. Zosyn per pharmacy consultation        Diarra Kos Eugene 01/26/2018, 6:00 AM

## 2018-01-26 NOTE — Progress Notes (Signed)
Pharmacy Antibiotic Note  Michael Richards is a 57 y.o. male admitted on 01/25/2018 with pneumonia.  Pharmacy has been consulted for zosyn dosing.  Plan: Zosyn 3.375g IV q8h (4 hour infusion).  F/u cultures and clinical course  Height: 5\' 10"  (177.8 cm) Weight: 173 lb 1 oz (78.5 kg) IBW/kg (Calculated) : 73  Temp (24hrs), Avg:99 F (37.2 C), Min:96.3 F (35.7 C), Max:101.5 F (38.6 C)  Recent Labs  Lab 01/25/18 1824 01/25/18 1828 01/25/18 1907 01/25/18 2156 01/26/18 0347  WBC 13.5*  --   --   --  5.9  CREATININE 0.63 0.60* 0.50* 0.40* 0.68    Estimated Creatinine Clearance: 105.2 mL/min (by C-G formula based on SCr of 0.68 mg/dL).    No Known Allergies   Thank you for allowing pharmacy to be a part of this patient's care.  Excell Seltzer Poteet 01/26/2018 6:01 AM

## 2018-01-26 NOTE — Progress Notes (Signed)
Notified [facility] of Serum Osm. 242 and Na+ 120.

## 2018-01-26 NOTE — Progress Notes (Signed)
Pt placed back on full vent support by MD. 

## 2018-01-27 DIAGNOSIS — R5381 Other malaise: Secondary | ICD-10-CM

## 2018-01-27 DIAGNOSIS — E871 Hypo-osmolality and hyponatremia: Secondary | ICD-10-CM

## 2018-01-27 DIAGNOSIS — G9340 Encephalopathy, unspecified: Secondary | ICD-10-CM

## 2018-01-27 NOTE — Progress Notes (Signed)
Patient back to baseline, no focal deficit, knows October, but states nursing home for location. Hyponatremic seizure, no seizure medicine  indicated.   Please call with further questions or concerns.   Roland Rack, MD [facility] [phone]  If 7pm- 7am, please page neurology on call as listed in [place].

## 2018-01-27 NOTE — Progress Notes (Signed)
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 PCCM asked to admit  Consults: date of consult/date signed off & final recs:  Neurology 01/25/18.  Procedures (surgical and bedside):  Intubation 01/25/18.  Activation 01/26/2018 extubated 01/26/2018 a.m. labs  Subjective:  Reported to have increased obsessive behavior over the last 6 months  Objective   Blood pressure 126/73, pulse 78, temperature 97.8 F (36.6 C), temperature source Oral, resp. rate 20, height 5\' 10"  (1.778 m), weight 73.9 kg, SpO2 97 %.    Vent Mode: PRVC FiO2 (%):  [30 %] 30 % Set Rate:  [16 bmp] 16 bmp Vt Set:  [580 mL] 580 mL PEEP:  [5 cmH20] 5 cmH20 Pressure Support:  [5 cmH20] 5 cmH20   Intake/Output Summary (Last 24 hours) at 01/27/2018 0844 Last data filed at 01/27/2018 0600 Gross per 24 hour  Intake 1935.09 ml  Output 4860 ml  Net -2924.91 ml   Filed Weights   01/25/18 1852 01/26/18 0500 01/27/18 0500  Weight: 86.2 kg 78.5 kg 73.9 kg    Examination: General: Awake alert somewhat dull effect HEENT EVD lymphadenopathy is appreciated Neuro: Dull effect follows commands has a history of mental retardation CV: Heart sounds are regular PULM: even/non-labored, lungs bilaterally diminished TF:TDDU, non-tender, bsx4 active  Extremities: warm/dry, 1+ edema  Skin: no rashes or lesions   Resolved Hospital Problem list    Assessment & Plan:   Symptomatic Hyponatremia: Psychogenic polydipsia coupled with Lexapro and GI losses. Currently adequately controlled Monitor sodium.  Seizures - most likely secondary to hyponatremia. \ Recent Labs  Lab 01/26/18 0347 01/26/18 0717 01/26/18 2022  NA 107* 123* 136   Sodium is been corrected  Monitor  water intake due to psychogenic polydipsia .  Endotracheally Intubated on Mechanical ventilation for airway protection following post ictal state Extubated 01/26/2018 Wean FiO2 as tolerated Pulmonary toilet  Hypokalemia- replete as necessary.   Non Gap Metabolic Acidosis- noted to have loose stools could be secondary to diarrhea.   Normocytic Anemia- Recent Labs    01/25/18 2156 01/26/18 0347  HGB 10.9* 10.3*  Transfuse per protocol   Disposition / Summary of Today's Plan 01/27/18   Extubated 01/26/2018 Diet ordered 01/27/2018 Plan to transfer to [facility] floor and to [facility].    Nutritional status and diet: free water restriction. Pain/Anxiety/Delirium reduction: off sedation. VAP protocol (if indicated) extubated DVT prophylaxis: UFH GI prophylaxis: famotidine Hyperglycemia protocol:  Mobility:Progressive ambulation  Antibiotic de-escalation: Will stop antibiotics and treat as sterile aspiration as of 01/27/2018 Code Status: Full Family Communication: 01/27/2018 no family at bedside at time of examination    CXR 10/4: mild bibasilar atelectasis.  No chest x-ray on 01/28/1999     [facility] Minor ACNP Maryanna Shape PCCM Pager [phone] till 1 pm If no answer page [phone] 01/27/2018, 8:44 AM  Attending Note:  57 year old male with psychogenic polydipsia presenting with seizures due to hyponatremia that was intubated for airway protection.  On exam, alert but dull affect with clear lungs.  I reviewed CXR myself, no acute disease noted.  Discussed with PCCM-NP and TRH-MD.    Hyponatremia:  - Limit fluid intake  - IVF  - BMET in AM  AMS: slowly improving  - Minimize sedation  -  Monitor clinically  Hypoxemia:  - Titrate O2 for sat of 88-92%  - Unlikely to need home O2  Deconditioning:  - OOB to chair  - PT evaluation  Will need to f/u with psych as outpatient  Transfer out of the ICU and to [facility] service with PCCM off 10/6  Patient seen and examined,  agree with above note.  I dictated the care and orders written for this patient under my direction.  Rush Farmer, [place]

## 2018-01-28 LAB — BASIC METABOLIC PANEL
Anion gap: 9 (ref 5–15)
BUN: 8 mg/dL (ref 6–20)
CALCIUM: 8.3 mg/dL — AB (ref 8.9–10.3)
CO2: 24 mmol/L (ref 22–32)
CREATININE: 0.65 mg/dL (ref 0.61–1.24)
Chloride: 101 mmol/L (ref 98–111)
Glucose, Bld: 107 mg/dL — ABNORMAL HIGH (ref 70–99)
Potassium: 3.4 mmol/L — ABNORMAL LOW (ref 3.5–5.1)
SODIUM: 134 mmol/L — AB (ref 135–145)

## 2018-01-28 LAB — MAGNESIUM: Magnesium: 2 mg/dL (ref 1.7–2.4)

## 2018-01-28 LAB — CULTURE, RESPIRATORY W GRAM STAIN: Culture: NORMAL

## 2018-01-28 LAB — PHOSPHORUS: PHOSPHORUS: 2.8 mg/dL (ref 2.5–4.6)

## 2018-01-28 LAB — CULTURE, RESPIRATORY

## 2018-01-28 NOTE — Discharge Instructions (Signed)
Follow with Bartholome Bill, MD in 5-7 days for repeat BMP  Please get a complete blood count and chemistry panel checked by your Primary MD at your next visit, and again as instructed by your Primary MD. Please get your medications reviewed and adjusted by your Primary MD.  Please request your Primary MD to go over all Hospital Tests and Procedure/Radiological results at the follow up, please get all Hospital records sent to your Prim MD by signing hospital release before you go home.  If you had Pneumonia of Lung problems at the Hospital: Please get a 2 view Chest X ray done in 6-8 weeks after hospital discharge or sooner if instructed by your Primary MD.  If you have Congestive Heart Failure: Please call your Cardiologist or Primary MD anytime you have any of the following symptoms:  1) 3 pound weight gain in 24 hours or 5 pounds in 1 week  2) shortness of breath, with or without a dry hacking cough  3) swelling in the hands, feet or stomach  4) if you have to sleep on extra pillows at night in order to breathe  Follow cardiac low salt diet and 1.5 lit/day fluid restriction.  If you have diabetes Accuchecks 4 times/day, Once in AM empty stomach and then before each meal. Log in all results and show them to your primary doctor at your next visit. If any glucose reading is under 80 or above 300 call your primary MD immediately.  If you have Seizure/Convulsions/Epilepsy: Please do not drive, operate heavy machinery, participate in activities at heights or participate in high speed sports until you have seen by Primary MD or a Neurologist and advised to do so again.  If you had Gastrointestinal Bleeding: Please ask your Primary MD to check a complete blood count within one week of discharge or at your next visit. Your endoscopic/colonoscopic biopsies that are pending at the time of discharge, will also need to followed by your Primary MD.  Get Medicines reviewed and adjusted. Please  take all your medications with you for your next visit with your Primary MD  Please request your Primary MD to go over all hospital tests and procedure/radiological results at the follow up, please ask your Primary MD to get all Hospital records sent to his/her office.  If you experience worsening of your admission symptoms, develop shortness of breath, life threatening emergency, suicidal or homicidal thoughts you must seek medical attention immediately by calling 911 or calling your MD immediately  if symptoms less severe.  You must read complete instructions/literature along with all the possible adverse reactions/side effects for all the Medicines you take and that have been prescribed to you. Take any new Medicines after you have completely understood and accpet all the possible adverse reactions/side effects.   Do not drive or operate heavy machinery when taking Pain medications.   Do not take more than prescribed Pain, Sleep and Anxiety Medications  Special Instructions: If you have smoked or chewed Tobacco  in the last 2 yrs please stop smoking, stop any regular Alcohol  and or any Recreational drug use.  Wear Seat belts while driving.  Please note You were cared for by a hospitalist during your hospital stay. If you have any questions about your discharge medications or the care you received while you were in the hospital after you are discharged, you can call the unit and asked to speak with the hospitalist on call if the hospitalist that took care of you  is not available. Once you are discharged, your primary care physician will handle any further medical issues. Please note that NO REFILLS for any discharge medications will be authorized once you are discharged, as it is imperative that you return to your primary care physician (or establish a relationship with a primary care physician if you do not have one) for your aftercare needs so that they can reassess your need for medications  and monitor your lab values.  You can reach the hospitalist office at phone [phone] or fax [phone]   If you do not have a primary care physician, you can call [phone] for a physician referral.  Activity: As tolerated with Full fall precautions use walker/cane & assistance as needed  Diet: regular  Disposition Home

## 2018-01-28 NOTE — Discharge Summary (Signed)
Physician Discharge Summary  Michael Richards WUJ:[id number] DOB: [birth year] DOA: 01/25/2018  PCP: Bartholome Bill, MD  Admit date: 01/25/2018 Discharge date: 01/28/2018  Admitted From: home Disposition:  home  Recommendations for Outpatient Follow-up:  1. Follow up with PCP in 1-2 weeks 2. Please obtain BMP/CBC in one week  Home Health: none Equipment/Devices: none  Discharge Condition: stable CODE STATUS: Full code Diet recommendation: regular  HPI: Per Dr. Gilford Raid, Michael Richards is a 57 y.o. 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  Required intubation for airway protection.  Hospital Course: Symptomatic Hyponatremia: Psychogenic polydipsia coupled with Lexapro and GI losses -patient was admitted to the hospital with seizures in the setting of symptomatic hyponatremia.  He has been exhibiting psychogenic polydipsia symptoms per family description of him ingesting large amounts of water mostly over the last few months.  He was recently started on Lexapro.  He was initially admitted to the ICU, that might also help some intubated for airway protection following postictal state, and was treated with normal saline and his sodium has now normalized and has remained stable.  His mental status is back to baseline, as confirmed by the family, and he will be discharged home in stable condition.  He was advised for outpatient follow-up with PCP within 3 to 4 days for repeat sodium level. Endotracheally Intubated on Mechanical ventilation for airway protection following post ictal state -resolved, extubated, on room air Hypokalemia-replete as necessary.  Non Gap Metabolic Acidosis- resolved.  Patient has 1-2 loose stools per day but they have been chronic for years, unlikely to cause this. Normocytic Anemia- Hb stable, no bleeding   Discharge Diagnoses:    Active Problems:   Seizure ([place])   Hyponatremia   Physical deconditioning   Acute encephalopathy     Discharge Instructions   Allergies as of 01/28/2018   No Known Allergies     Medication List    STOP taking these medications   escitalopram 10 MG tablet Commonly known as:  LEXAPRO     TAKE these medications   benztropine 2 MG tablet Commonly known as:  COGENTIN Take 2 mg by mouth 2 (two) times daily.   CENTRUM SILVER PO Take 1 tablet by mouth daily.   clonazePAM 0.5 MG tablet Commonly known as:  KLONOPIN Take 0.125-0.75 mg by mouth See admin instructions. 0.125mg  in the morning and afternoon, then 0.75mg  at bedtime.   ranitidine 300 MG capsule Commonly known as:  ZANTAC Take 300 mg by mouth at bedtime.   risperiDONE 1 MG tablet Commonly known as:  RISPERDAL Take 1 mg by mouth daily.      Follow-up Information    Bartholome Bill, MD. Schedule an appointment as soon as possible for a visit in 1 week(s).   Specialty:  Family Medicine Why:  for blood work (BMP) Contact information: [place] [address] [phone]           Consultations:  Critical care  Procedures/Studies:  Ct Head Wo Contrast  Result Date: 01/25/2018 CLINICAL DATA:  Face swelling, shaking. Last seen normal at 1500 hours. EXAM: CT HEAD WITHOUT CONTRAST TECHNIQUE: Contiguous axial images were obtained from the base of the skull through the vertex without intravenous contrast. COMPARISON:  None. FINDINGS: Moderate motion degraded examination. BRAIN: No intraparenchymal hemorrhage, mass effect nor midline shift. The ventricles and sulci  are normal. No acute large vascular territory infarcts. No abnormal extra-axial fluid collections. Basal cisterns are patent. VASCULAR: Unremarkable. SKULL/SOFT TISSUES: No skull fracture. No significant soft tissue swelling. ORBITS/SINUSES: The included ocular globes and orbital contents are normal.Trace paranasal sinus  mucosal thickening. Mastoid air cells are well aerated. OTHER: Life support lines in place. IMPRESSION: Negative moderately motion degraded CT HEAD without contrast. Acute findings discussed with and reconfirmed by Dr.JULIE HAVILAND on 01/25/2018 at 8:34 pm. Electronically Signed   By: Elon Alas M.D.   On: 01/25/2018 20:36   Dg Chest Port 1 View  Result Date: 01/26/2018 CLINICAL DATA:  Post intubation. EXAM: PORTABLE CHEST 1 VIEW COMPARISON:  01/25/2018 FINDINGS: Endotracheal tube terminates 5.5 cm from the carina. Enteric catheter transverses the thorax. Cardiomediastinal silhouette is normal. Mediastinal contours appear intact. There is no evidence of focal airspace consolidation, pleural effusion or pneumothorax. Osseous structures are without acute abnormality. Soft tissues are grossly normal. IMPRESSION: Endotracheal tube terminates 5.5 cm superior to the carina. Advancement with approximately 2 cm may be considered. Electronically Signed   By: Fidela Salisbury M.D.   On: 01/26/2018 08:14   Dg Chest Portable 1 View  Result Date: 01/25/2018 CLINICAL DATA:  Intubated. EXAM: PORTABLE CHEST 1 VIEW COMPARISON:  Earlier today. FINDINGS: Endotracheal tube tip just above the level of the clavicles. Nasogastric tube extending into the stomach. Mildly enlarged cardiac silhouette with improvement. Clear lungs. Decrease in prominence of the pulmonary vasculature and interstitial markings. Thoracic spine degenerative changes. IMPRESSION: 1. Endotracheal tube slightly high in position. It is recommended that this be advanced 1 cm. 2. Improved changes of congestive heart failure. Electronically Signed   By: Claudie Revering M.D.   On: 01/25/2018 20:19   Dg Chest Portable 1 View  Result Date: 01/25/2018 CLINICAL DATA:  Acute shortness of breath. EXAM: PORTABLE CHEST 1 VIEW COMPARISON:  None FINDINGS: The cardiomediastinal silhouette is unremarkable. Mild peribronchial thickening noted. There is no evidence of  focal airspace disease, pulmonary edema, suspicious pulmonary nodule/mass, pleural effusion, or pneumothorax. No acute bony abnormalities are identified. IMPRESSION: Mild peribronchial thickening without focal pneumonia. This is of uncertain chronicity. Electronically Signed   By: Margarette Canada M.D.   On: 01/25/2018 19:45     Subjective: - no chest pain, shortness of breath, no abdominal pain, nausea or vomiting. Anxious to go home   Discharge Exam: Vitals:   01/28/18 0501 01/28/18 0758  BP: 117/73 (!) 158/98  Pulse: 68 79  Resp:  20  Temp: 97.7 F (36.5 C) (!) 97.5 F (36.4 C)  SpO2: 97% 97%    General: Pt is alert, awake, not in acute distress Cardiovascular: RRR, S1/S2 +, no rubs, no gallops Respiratory: CTA bilaterally, no wheezing, no rhonchi Abdominal: Soft, NT, ND, bowel sounds + Extremities: no edema, no cyanosis    The results of significant diagnostics from this hospitalization (including imaging, microbiology, ancillary and laboratory) are listed below for reference.     Microbiology: Recent Results (from the past 240 hour(s))  MRSA PCR Screening     Status: None   Collection Time: 01/25/18 10:46 PM  Result Value Ref Range Status   MRSA by PCR NEGATIVE NEGATIVE Final    Comment:        The GeneXpert MRSA Assay (FDA approved for NASAL specimens only), is one component of a comprehensive MRSA colonization surveillance program. It is not intended to diagnose MRSA infection nor to guide or monitor treatment for MRSA infections. Performed at [facility],  [address] [place], [place] [address]   Culture, blood (Routine X 2) w Reflex to ID Panel     Status: None (Preliminary result)   Collection Time: 01/26/18  7:10 AM  Result Value Ref Range Status   Specimen Description BLOOD BLOOD RIGHT HAND  Final   Special Requests   Final    BOTTLES DRAWN AEROBIC ONLY Blood Culture results may not be optimal due to an inadequate volume of blood received in  culture bottles   Culture   Final    NO GROWTH 1 DAY Performed at [facility], [place] [address], [place], [place] [address]    Report Status PENDING  Incomplete  Culture, blood (Routine X 2) w Reflex to ID Panel     Status: None (Preliminary result)   Collection Time: 01/26/18  7:16 AM  Result Value Ref Range Status   Specimen Description BLOOD BLOOD RIGHT HAND  Final   Special Requests   Final    BOTTLES DRAWN AEROBIC ONLY Blood Culture adequate volume   Culture   Final    NO GROWTH 1 DAY Performed at [facility], [place] [address] [place], [place] [address]    Report Status PENDING  Incomplete  Culture, respiratory (non-expectorated)     Status: None   Collection Time: 01/26/18  8:16 AM  Result Value Ref Range Status   Specimen Description TRACHEAL ASPIRATE  Final   Special Requests NONE  Final   Gram Stain   Final    ABUNDANT WBC PRESENT, PREDOMINANTLY PMN MODERATE GRAM POSITIVE RODS MODERATE GRAM POSITIVE COCCI    Culture   Final    Consistent with normal respiratory flora. Performed at [facility], [place] [address] [place], [place] [address]    Report Status 01/28/2018 FINAL  Final     Labs: BNP (last 3 results) No results for input(s): BNP in the last 8760 hours. Basic Metabolic Panel: Recent Labs  Lab 01/25/18 1824  01/25/18 2156 01/26/18 0000 01/26/18 0347 01/26/18 0717 01/26/18 2022 01/28/18 0639  NA 108*   < > 111* 120* 107* 123* 136 134*  K 4.4   < > 5.4*  --  2.5* 3.2* 3.7 3.4*  CL 75*   < > 81*  --  77* 91* 105 101  CO2 17*  --   --   --  21* 21* 24 24  GLUCOSE 94   < > 105*  --  619* 93 84 107*  BUN 9   < > 8  --  6 5* <5* 8  CREATININE 0.63   < > 0.40*  --  0.68 0.60* 0.75 0.65  CALCIUM 8.4*  --   --   --  6.9* 8.4* 8.2* 8.3*  MG  --   --   --   --  1.6*  --   --  2.0  PHOS  --   --   --   --  2.1*  --   --  2.8   < > = values in this interval not displayed.   Liver Function Tests: Recent Labs  Lab 01/25/18 1824  01/26/18 0717  AST 29 36  ALT 23 22  ALKPHOS 83 75  BILITOT 1.7* 1.2  PROT 6.0* 5.1*  ALBUMIN 3.9 3.3*   No results for input(s): LIPASE, AMYLASE in the last 168 hours. No results for input(s): AMMONIA in the last 168 hours. CBC: Recent Labs  Lab 01/25/18 1824 01/25/18 1828 01/25/18 1907  01/25/18 2156 01/26/18 0347  WBC 13.5*  --   --   --  5.9  NEUTROABS 10.9*  --   --   --   --   HGB 12.7* 12.2* 11.6* 10.9* 10.3*  HCT 33.8* 36.0* 34.0* 32.0* 28.0*  MCV 84.1  --   --   --  85.1  PLT 239  --   --   --  139*   Cardiac Enzymes: No results for input(s): CKTOTAL, CKMB, CKMBINDEX, TROPONINI in the last 168 hours. BNP: Invalid input(s): POCBNP CBG: Recent Labs  Lab 01/25/18 1907 01/26/18 0550  GLUCAP 115* 103*   D-Dimer No results for input(s): DDIMER in the last 72 hours. Hgb A1c No results for input(s): HGBA1C in the last 72 hours. Lipid Profile Recent Labs    01/26/18 0000  TRIG 62   Thyroid function studies No results for input(s): TSH, T4TOTAL, T3FREE, THYROIDAB in the last 72 hours.  Invalid input(s): FREET3 Anemia work up No results for input(s): VITAMINB12, FOLATE, FERRITIN, TIBC, IRON, RETICCTPCT in the last 72 hours. Urinalysis    Component Value Date/Time   COLORURINE STRAW (A) 01/26/2018 0827   APPEARANCEUR CLEAR 01/26/2018 0827   LABSPEC 1.001 (L) 01/26/2018 0827   PHURINE 6.0 01/26/2018 0827   GLUCOSEU NEGATIVE 01/26/2018 0827   HGBUR NEGATIVE 01/26/2018 0827   BILIRUBINUR NEGATIVE 01/26/2018 0827   KETONESUR 5 (A) 01/26/2018 0827   PROTEINUR NEGATIVE 01/26/2018 0827   NITRITE NEGATIVE 01/26/2018 0827   LEUKOCYTESUR NEGATIVE 01/26/2018 0827   Sepsis Labs Invalid input(s): PROCALCITONIN,  WBC,  LACTICIDVEN   Time coordinating discharge: 40 minutes  SIGNED:  Marzetta Board, MD  [facility] 01/28/2018, 1:27 PM Pager [phone]  If 7PM-7AM, please contact night-coverage [email or web address] Password [email or web address]

## 2018-01-28 NOTE — Progress Notes (Signed)
Pt. Discharged home with family. Discharge instructions given. Pt. And family questions asked and answered. IV and foley catheter discontinued. Pt. Transported from unit with staff via wheelchair. Michael Richards T Brian Kocourek

## 2018-01-28 NOTE — Evaluation (Signed)
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  Mobilizing well with no noted focal deficits at this time. No further acute PT needs. Will sign off.    Follow Up Recommendations No PT follow up    Equipment Recommendations  None recommended by PT    Recommendations for Other Services       Precautions / Restrictions        Mobility  Bed Mobility Overal bed mobility: Independent                Transfers Overall transfer level: Independent Equipment used: None             General transfer comment: no difficulty elevating to upright  Ambulation/Gait Ambulation/Gait assistance: Independent Gait Distance (Feet): 360 Feet Assistive device: None       General Gait Details: initially shuffling gait, but improved with distance  Stairs Stairs: Yes Stairs assistance: Modified independent (Device/Increase time) Stair Management: Two rails Number of Stairs: 5 General stair comments: no difficulty with stair neogtiation  Wheelchair Mobility    Modified Rankin (Stroke Patients Only)       Balance Overall balance assessment: Independent                                           Pertinent Vitals/Pain Pain Assessment: No/denies pain    Home Living Family/patient expects to be discharged to:: Private residence Living Arrangements: Parent Available Help at Discharge: Family Type of Home: House(town home) Home Access: Stairs to enter Entrance Stairs-Rails: Can reach both Entrance Stairs-Number of Steps: 3 Home Layout: Two level;Able to live on main level with bedroom/bathroom(bonus room upstairs)        Prior Function Level of Independence: Independent(per family PRN supervision but  independent with activities)               Hand Dominance   Dominant Hand: Right    Extremity/Trunk Assessment   Upper Extremity Assessment Upper Extremity Assessment: Overall WFL for tasks assessed    Lower Extremity Assessment Lower Extremity Assessment: Overall WFL for tasks assessed       Communication   Communication: (slow verbal response, but approprite)  Cognition Arousal/Alertness: Awake/alert Behavior During Therapy: WFL for tasks assessed/performed Overall Cognitive Status: Within Functional Limits for tasks assessed                                        General Comments      Exercises     Assessment/Plan    PT Assessment Patent does not need any further PT services  PT Problem List         PT Treatment Interventions      PT Goals (Current goals can be found in the Care Plan section)  Acute Rehab PT Goals PT Goal Formulation: All assessment and education complete, DC therapy    Frequency     Barriers to discharge        Co-evaluation               AM-PAC PT "6 Clicks" Daily Activity  Outcome Measure  Difficulty turning over in bed (including adjusting bedclothes, sheets and blankets)?: None Difficulty moving from lying on back to sitting on the side of the bed? : None Difficulty sitting down on and standing up from a chair with arms (e.g., wheelchair, bedside commode, etc,.)?: None Help needed moving to and from a bed to chair (including a wheelchair)?: None Help needed walking in hospital room?: None Help needed climbing 3-5 steps with a railing? : A Little 6 Click Score: 23    End of Session Equipment Utilized During Treatment: Gait belt Activity Tolerance: Patient tolerated treatment well Patient left: in chair;with chair alarm set;with call bell/phone within reach;with family/visitor present Nurse Communication: Mobility status PT Visit Diagnosis: Other symptoms and signs involving the nervous system  (R29.898)    Time: 6578-4696 PT Time Calculation (min) (ACUTE ONLY): 18 min   Charges:   PT Evaluation $PT Eval Low Complexity: Wanda, PT DPT  Board Certified Neurologic Specialist Acute Rehabilitation Services Pager [phone] Office [phone]   Duncan Dull 01/28/2018, 8:57 AM

## 2018-01-31 LAB — CULTURE, BLOOD (ROUTINE X 2)
CULTURE: NO GROWTH
CULTURE: NO GROWTH
SPECIAL REQUESTS: ADEQUATE

## 2019-02-14 IMAGING — CT CT HEAD W/O CM
4 series · 15 of 47 positions shown, 17 images · non-contrast
Comparison: None.

CLINICAL DATA: Face swelling, shaking. Last seen normal at 1277
hours.

EXAM:
CT HEAD WITHOUT CONTRAST
TECHNIQUE: Contiguous axial images were obtained from the base of the skull
through the vertex without intravenous contrast.

[Series 3: head without · axial · non-contrast · 0.42mm/px · z∈[-244,-114]mm · 6 of 38 slices shown, 8 images]
[im 6/38  brain]
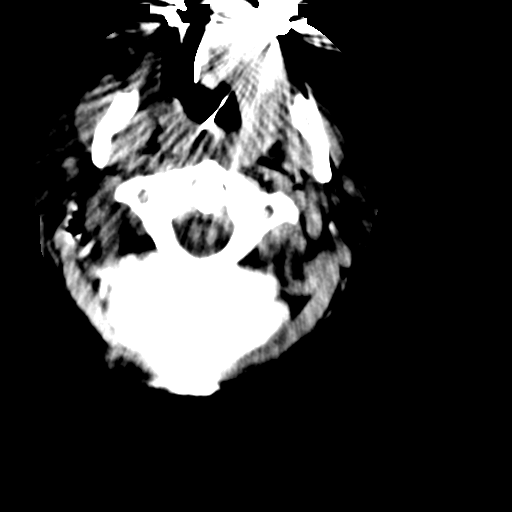
[im 6/38  bone]
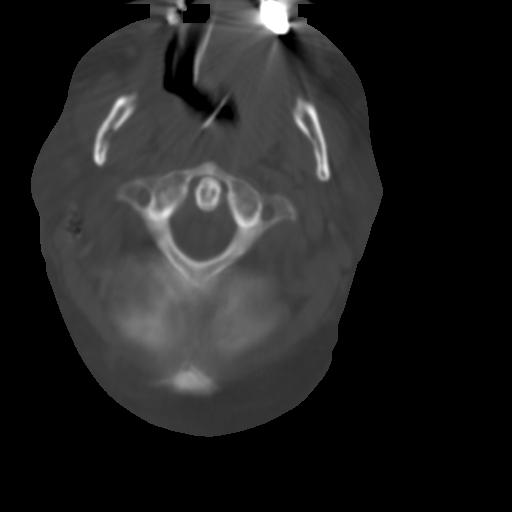
[im 11/38  brain]
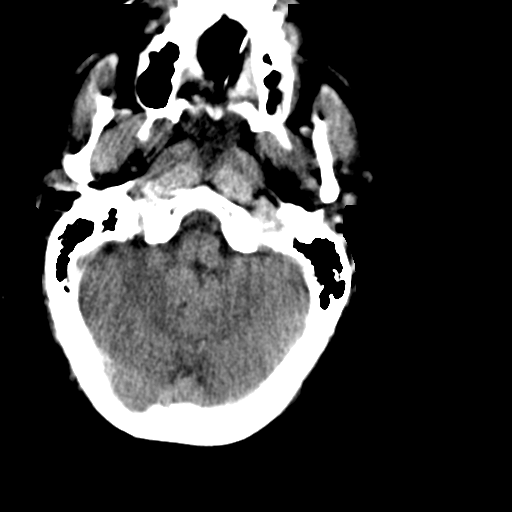
[im 16/38  brain]
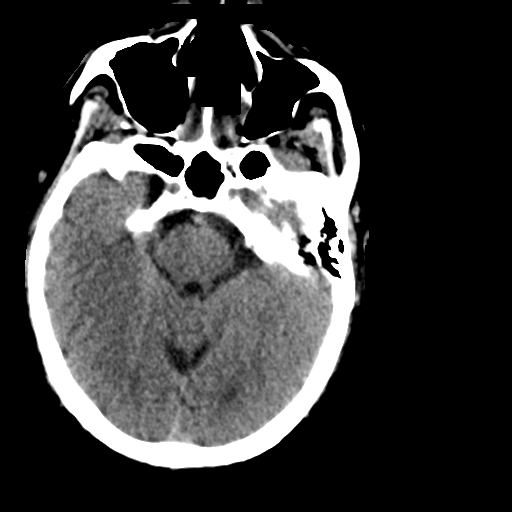
[im 22/38  brain]
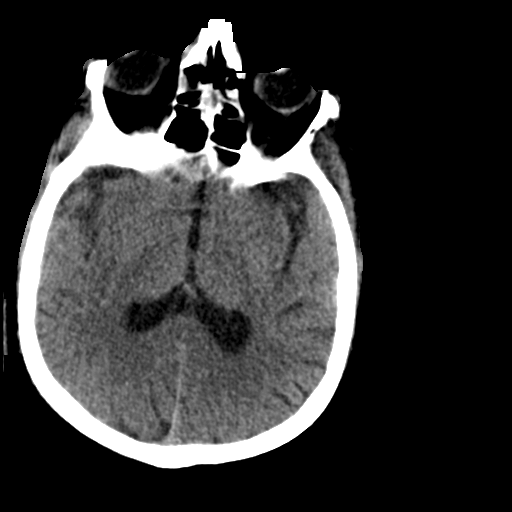
[im 27/38  brain]
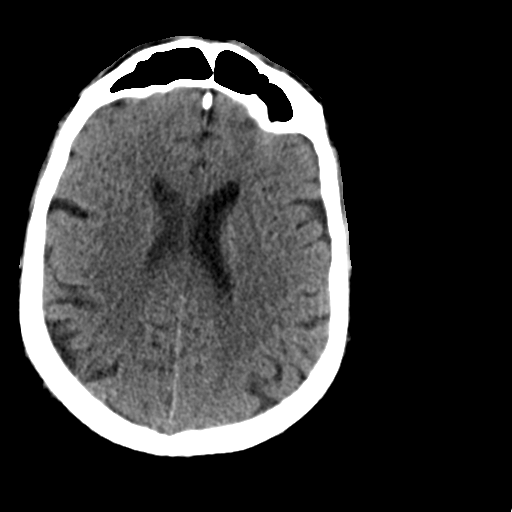
[im 27/38  bone]
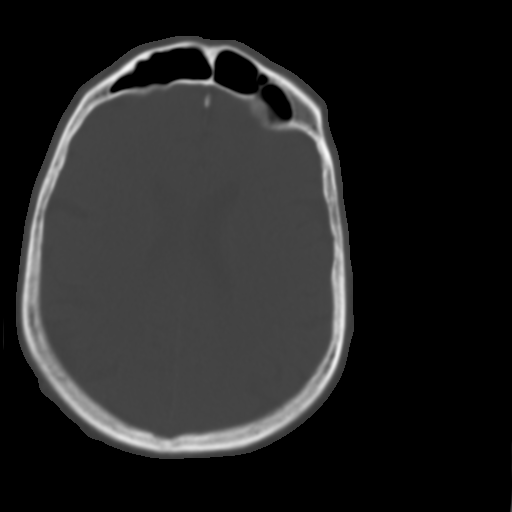
[im 32/38  brain]
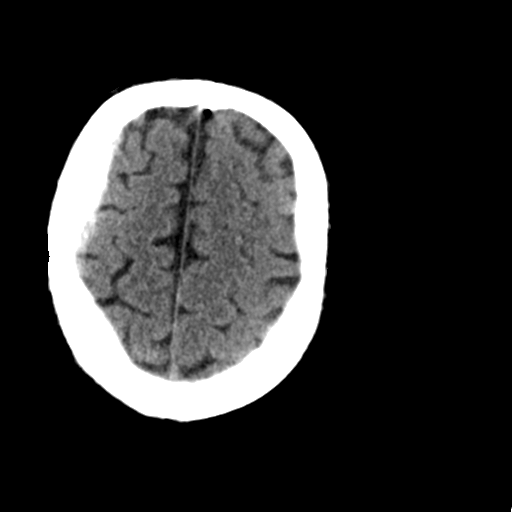

[Series 4: head bone · axial · 0.42mm/px · z∈[-251,-205]mm · 3 of 97 slices shown]
[im 10/97  bone]
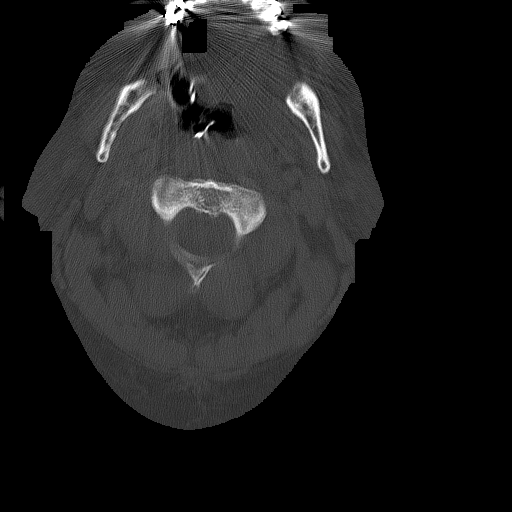
[im 19/97  bone]
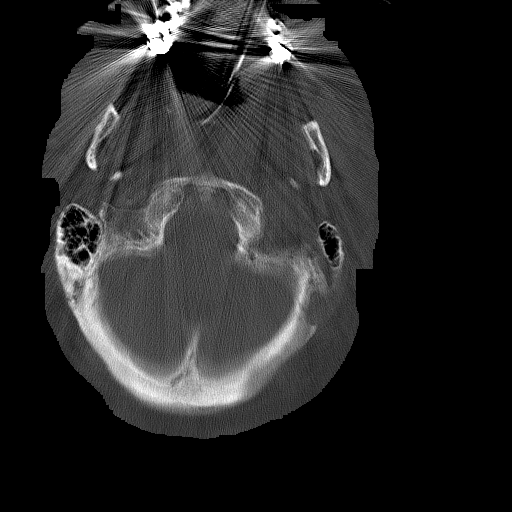
[im 33/97  bone]
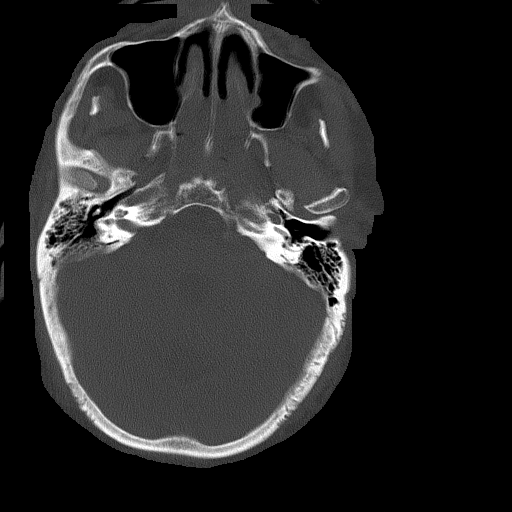

[Series 5: head without cor · coronal · non-contrast · 0.40mm/px · 3 of 77 slices shown]
[im 26/77  brain]
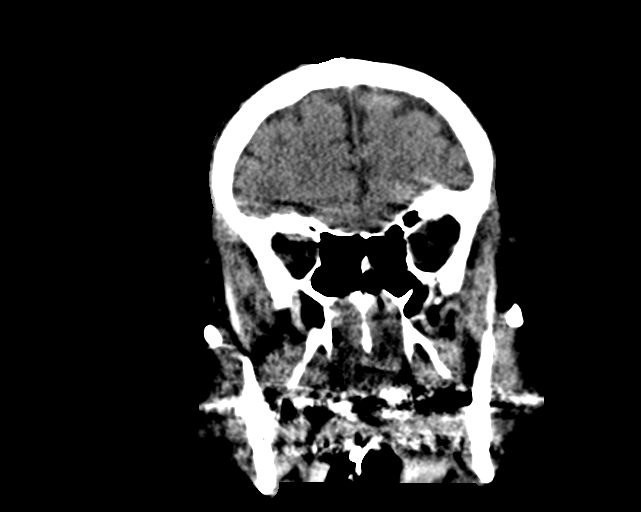
[im 34/77  brain]
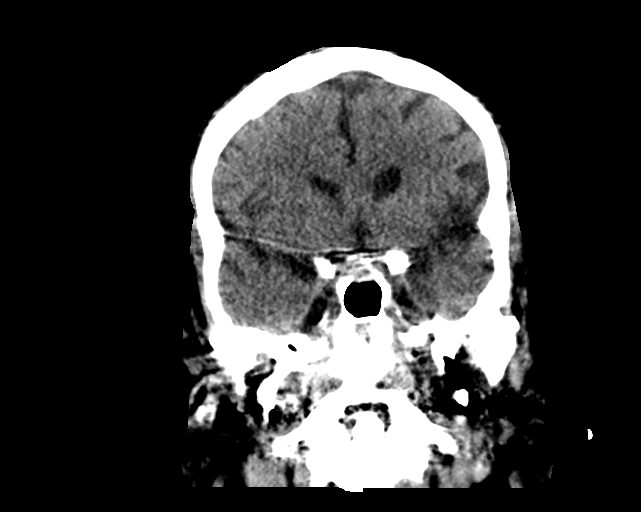
[im 43/77  brain]
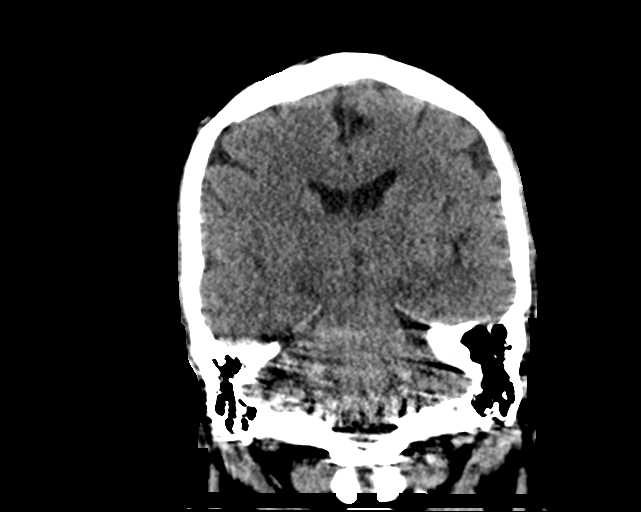

[Series 6: head without sag · sagittal · non-contrast · 0.40mm/px · 3 of 66 slices shown]
[im 22/66  brain]
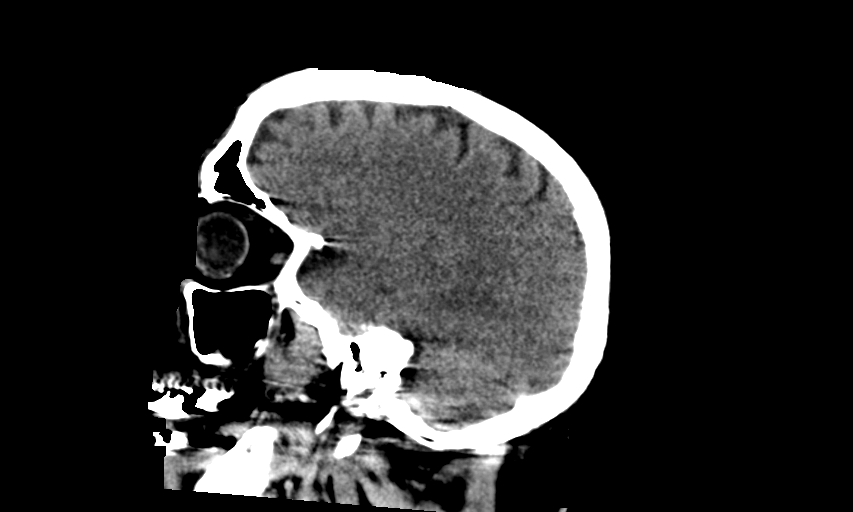
[im 33/66  brain]
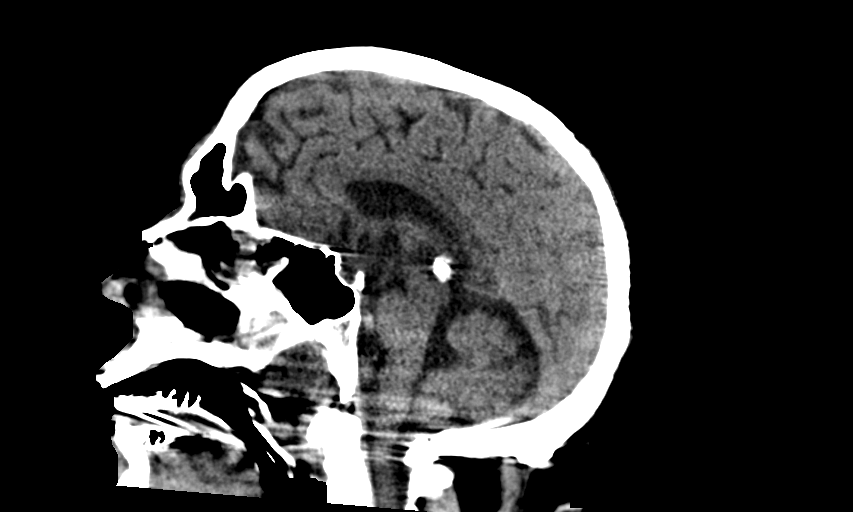
[im 44/66  brain]
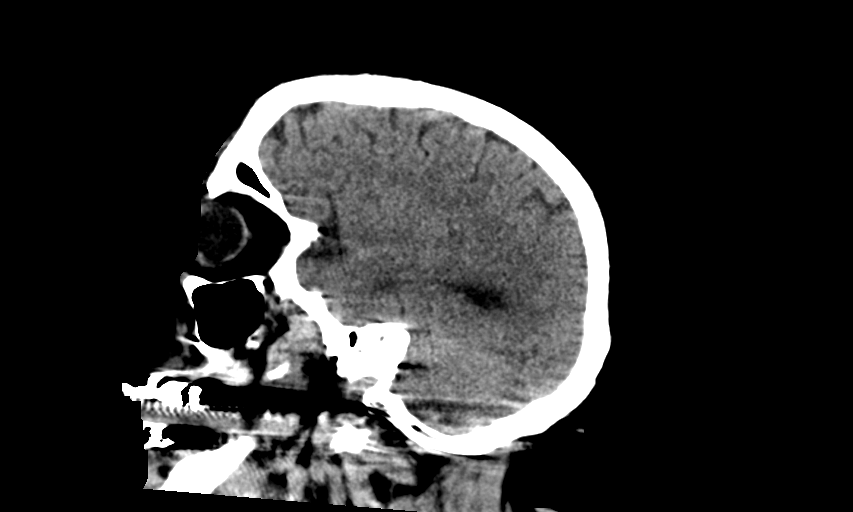

[15 of 47 positions shown; findings below may reference images not displayed]

FINDINGS: Moderate motion degraded examination.

BRAIN: No intraparenchymal hemorrhage, mass effect nor midline
shift. The ventricles and sulci are normal. No acute large vascular
territory infarcts. No abnormal extra-axial fluid collections. Basal
cisterns are patent.

VASCULAR: Unremarkable.

SKULL/SOFT TISSUES: No skull fracture. No significant soft tissue
swelling.

ORBITS/SINUSES: The included ocular globes and orbital contents are
normal.Trace paranasal sinus mucosal thickening. Mastoid air cells
are well aerated.

OTHER: Life support lines in place.
IMPRESSION: Negative moderately motion degraded CT HEAD without contrast.

Acute findings discussed with and reconfirmed by Dr.ELPE HG
on 01/25/2018 at [DATE].

## 2019-02-15 IMAGING — DX DG CHEST 1V PORT
1 series · 1 of 1 positions shown · non-contrast
Comparison: 01/25/2018

CLINICAL DATA: Post intubation.

EXAM:
PORTABLE CHEST 1 VIEW

[chest ap]
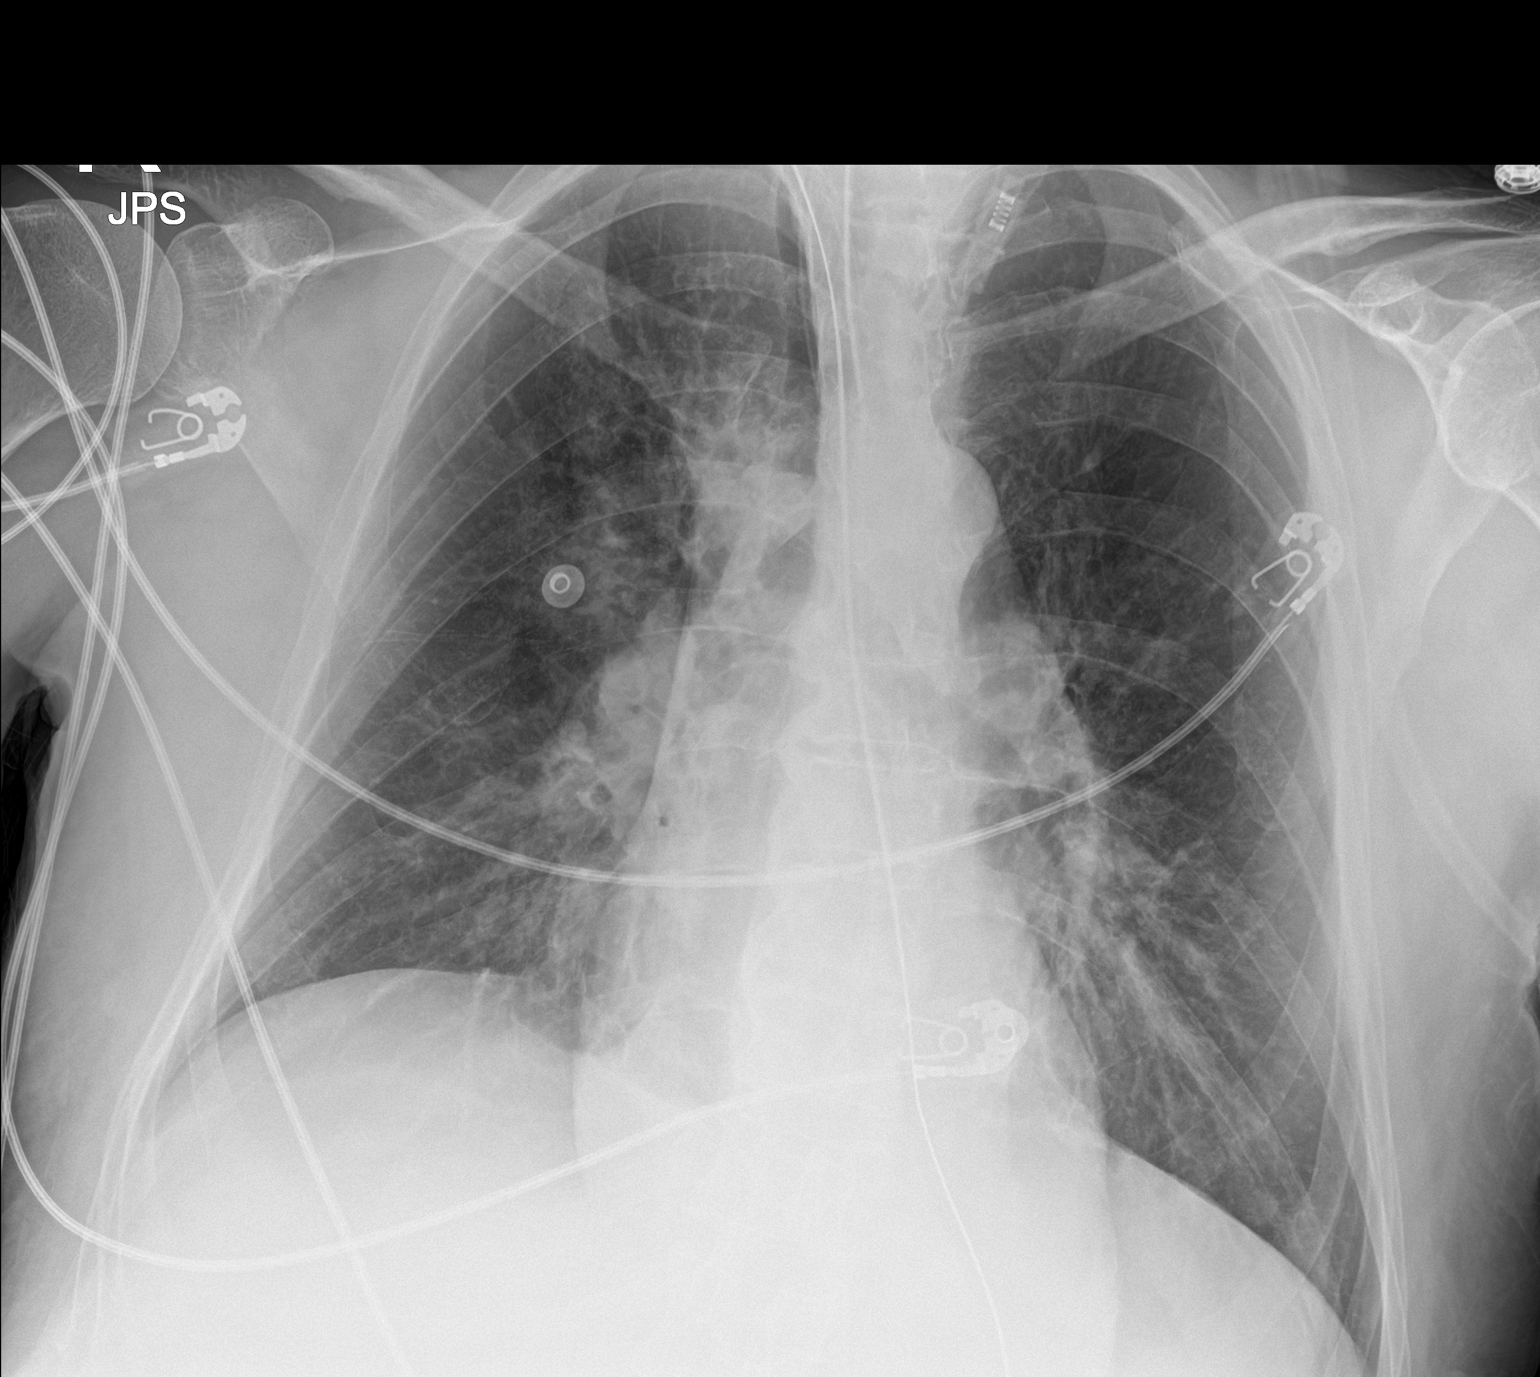

[1 of 1 positions shown; findings below may reference images not displayed]

FINDINGS: Endotracheal tube terminates 5.5 cm from the carina. Enteric
catheter transverses the thorax.

Cardiomediastinal silhouette is normal. Mediastinal contours appear
intact.

There is no evidence of focal airspace consolidation, pleural
effusion or pneumothorax.

Osseous structures are without acute abnormality. Soft tissues are
grossly normal.
IMPRESSION: Endotracheal tube terminates 5.5 cm superior to the carina.
Advancement with approximately 2 cm may be considered.

## 2021-12-31 ENCOUNTER — Encounter: Payer: Self-pay | Admitting: Gastroenterology
